# Patient Record
Sex: Female | Born: 1955 | Race: Black or African American | Hispanic: No | Marital: Married | Smoking: Never smoker
Health system: Southern US, Community
[De-identification: ages and names within clinical notes are randomized; demographics above are authoritative.]

## PROBLEM LIST (undated history)

## (undated) DIAGNOSIS — I10 Essential (primary) hypertension: Secondary | ICD-10-CM

## (undated) DIAGNOSIS — J449 Chronic obstructive pulmonary disease, unspecified: Secondary | ICD-10-CM

## (undated) DIAGNOSIS — R112 Nausea with vomiting, unspecified: Secondary | ICD-10-CM

## (undated) DIAGNOSIS — K635 Polyp of colon: Secondary | ICD-10-CM

## (undated) DIAGNOSIS — I359 Nonrheumatic aortic valve disorder, unspecified: Secondary | ICD-10-CM

## (undated) DIAGNOSIS — F32A Depression, unspecified: Secondary | ICD-10-CM

## (undated) DIAGNOSIS — F419 Anxiety disorder, unspecified: Secondary | ICD-10-CM

## (undated) DIAGNOSIS — K219 Gastro-esophageal reflux disease without esophagitis: Secondary | ICD-10-CM

## (undated) DIAGNOSIS — K2270 Barrett's esophagus without dysplasia: Secondary | ICD-10-CM

## (undated) DIAGNOSIS — M1990 Unspecified osteoarthritis, unspecified site: Secondary | ICD-10-CM

## (undated) DIAGNOSIS — Z720 Tobacco use: Secondary | ICD-10-CM

## (undated) DIAGNOSIS — K449 Diaphragmatic hernia without obstruction or gangrene: Secondary | ICD-10-CM

## (undated) DIAGNOSIS — G43909 Migraine, unspecified, not intractable, without status migrainosus: Secondary | ICD-10-CM

## (undated) DIAGNOSIS — M109 Gout, unspecified: Secondary | ICD-10-CM

## (undated) HISTORY — DX: Diaphragmatic hernia without obstruction or gangrene: K44.9

## (undated) HISTORY — DX: Unspecified osteoarthritis, unspecified site: M19.90

## (undated) HISTORY — DX: Nonrheumatic aortic valve disorder, unspecified: I35.9

## (undated) HISTORY — DX: Depression, unspecified: F32.A

## (undated) HISTORY — DX: Anxiety disorder, unspecified: F41.9

## (undated) HISTORY — DX: Gout, unspecified: M10.9

## (undated) HISTORY — DX: Migraine, unspecified, not intractable, without status migrainosus: G43.909

## (undated) HISTORY — DX: Chronic obstructive pulmonary disease, unspecified: J44.9

## (undated) HISTORY — DX: Gastro-esophageal reflux disease without esophagitis: K21.9

## (undated) HISTORY — PX: ABDOMINAL HYSTERECTOMY: SHX81

## (undated) HISTORY — PX: GASTRIC BYPASS: SHX52

---
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
## 1990-08-23 HISTORY — PX: OTHER SURGICAL HISTORY: SHX169

## 1997-12-30 ENCOUNTER — Ambulatory Visit (HOSPITAL_COMMUNITY): Admission: RE | Admit: 1997-12-30 | Discharge: 1997-12-30 | Payer: Self-pay | Admitting: Obstetrics and Gynecology

## 1999-09-16 ENCOUNTER — Ambulatory Visit (HOSPITAL_COMMUNITY): Admission: RE | Admit: 1999-09-16 | Discharge: 1999-09-16 | Payer: Self-pay | Admitting: Obstetrics and Gynecology

## 1999-09-16 ENCOUNTER — Encounter: Payer: Self-pay | Admitting: Obstetrics and Gynecology

## 1999-09-22 ENCOUNTER — Other Ambulatory Visit: Admission: RE | Admit: 1999-09-22 | Discharge: 1999-09-22 | Payer: Self-pay | Admitting: Obstetrics and Gynecology

## 2000-09-14 ENCOUNTER — Other Ambulatory Visit: Admission: RE | Admit: 2000-09-14 | Discharge: 2000-09-14 | Payer: Self-pay | Admitting: Obstetrics and Gynecology

## 2000-09-20 ENCOUNTER — Ambulatory Visit (HOSPITAL_COMMUNITY): Admission: RE | Admit: 2000-09-20 | Discharge: 2000-09-20 | Payer: Self-pay | Admitting: Obstetrics and Gynecology

## 2000-09-20 ENCOUNTER — Encounter: Payer: Self-pay | Admitting: Obstetrics and Gynecology

## 2001-09-26 ENCOUNTER — Ambulatory Visit (HOSPITAL_COMMUNITY): Admission: RE | Admit: 2001-09-26 | Discharge: 2001-09-26 | Payer: Self-pay | Admitting: Obstetrics and Gynecology

## 2001-09-26 ENCOUNTER — Encounter: Payer: Self-pay | Admitting: Obstetrics and Gynecology

## 2001-10-10 ENCOUNTER — Other Ambulatory Visit: Admission: RE | Admit: 2001-10-10 | Discharge: 2001-10-10 | Payer: Self-pay | Admitting: Obstetrics and Gynecology

## 2001-12-14 ENCOUNTER — Ambulatory Visit (HOSPITAL_BASED_OUTPATIENT_CLINIC_OR_DEPARTMENT_OTHER): Admission: RE | Admit: 2001-12-14 | Discharge: 2001-12-14 | Payer: Self-pay | Admitting: Oral Surgery

## 2002-08-23 HISTORY — PX: ACHILLES TENDON REPAIR: SUR1153

## 2002-11-01 ENCOUNTER — Ambulatory Visit (HOSPITAL_COMMUNITY): Admission: RE | Admit: 2002-11-01 | Discharge: 2002-11-01 | Payer: Self-pay | Admitting: Obstetrics and Gynecology

## 2002-11-01 ENCOUNTER — Encounter: Payer: Self-pay | Admitting: Obstetrics and Gynecology

## 2002-11-13 ENCOUNTER — Other Ambulatory Visit: Admission: RE | Admit: 2002-11-13 | Discharge: 2002-11-13 | Payer: Self-pay | Admitting: Obstetrics and Gynecology

## 2003-08-24 HISTORY — PX: CHOLECYSTECTOMY: SHX55

## 2004-06-07 ENCOUNTER — Emergency Department (HOSPITAL_COMMUNITY): Admission: EM | Admit: 2004-06-07 | Discharge: 2004-06-07 | Payer: Self-pay | Admitting: Internal Medicine

## 2004-07-03 ENCOUNTER — Emergency Department (HOSPITAL_COMMUNITY): Admission: EM | Admit: 2004-07-03 | Discharge: 2004-07-03 | Payer: Self-pay | Admitting: Family Medicine

## 2004-08-05 ENCOUNTER — Emergency Department (HOSPITAL_COMMUNITY): Admission: EM | Admit: 2004-08-05 | Discharge: 2004-08-05 | Payer: Self-pay | Admitting: Family Medicine

## 2004-12-16 ENCOUNTER — Ambulatory Visit: Payer: Self-pay | Admitting: Family Medicine

## 2004-12-25 ENCOUNTER — Ambulatory Visit: Payer: Self-pay | Admitting: Family Medicine

## 2005-02-02 ENCOUNTER — Ambulatory Visit: Payer: Self-pay | Admitting: Family Medicine

## 2005-02-10 ENCOUNTER — Ambulatory Visit (HOSPITAL_COMMUNITY): Admission: RE | Admit: 2005-02-10 | Discharge: 2005-02-10 | Payer: Self-pay | Admitting: Family Medicine

## 2005-07-17 ENCOUNTER — Emergency Department (HOSPITAL_COMMUNITY): Admission: EM | Admit: 2005-07-17 | Discharge: 2005-07-17 | Payer: Self-pay | Admitting: Family Medicine

## 2005-07-17 ENCOUNTER — Ambulatory Visit: Payer: Self-pay | Admitting: Sports Medicine

## 2005-07-17 ENCOUNTER — Inpatient Hospital Stay (HOSPITAL_COMMUNITY): Admission: AD | Admit: 2005-07-17 | Discharge: 2005-07-23 | Payer: Self-pay | Admitting: Family Medicine

## 2005-07-26 ENCOUNTER — Ambulatory Visit: Payer: Self-pay | Admitting: Family Medicine

## 2005-07-28 ENCOUNTER — Emergency Department (HOSPITAL_COMMUNITY): Admission: EM | Admit: 2005-07-28 | Discharge: 2005-07-28 | Payer: Self-pay | Admitting: Emergency Medicine

## 2005-07-30 ENCOUNTER — Ambulatory Visit: Payer: Self-pay | Admitting: Family Medicine

## 2005-08-04 ENCOUNTER — Encounter: Admission: RE | Admit: 2005-08-04 | Discharge: 2005-08-04 | Payer: Self-pay | Admitting: Sports Medicine

## 2005-08-06 ENCOUNTER — Ambulatory Visit: Payer: Self-pay | Admitting: Family Medicine

## 2005-08-07 ENCOUNTER — Encounter: Admission: RE | Admit: 2005-08-07 | Discharge: 2005-08-07 | Payer: Self-pay | Admitting: Sports Medicine

## 2005-08-13 ENCOUNTER — Ambulatory Visit: Payer: Self-pay | Admitting: Sports Medicine

## 2005-08-20 ENCOUNTER — Ambulatory Visit: Payer: Self-pay | Admitting: Sports Medicine

## 2005-08-27 ENCOUNTER — Ambulatory Visit: Payer: Self-pay | Admitting: Family Medicine

## 2005-08-30 ENCOUNTER — Ambulatory Visit: Payer: Self-pay | Admitting: Family Medicine

## 2005-09-03 ENCOUNTER — Ambulatory Visit: Payer: Self-pay | Admitting: Sports Medicine

## 2005-09-08 ENCOUNTER — Ambulatory Visit: Payer: Self-pay | Admitting: Family Medicine

## 2005-09-16 ENCOUNTER — Ambulatory Visit: Payer: Self-pay | Admitting: Sports Medicine

## 2005-09-27 ENCOUNTER — Ambulatory Visit: Payer: Self-pay | Admitting: Family Medicine

## 2005-10-11 ENCOUNTER — Ambulatory Visit: Payer: Self-pay | Admitting: Family Medicine

## 2005-10-25 ENCOUNTER — Ambulatory Visit: Payer: Self-pay | Admitting: Family Medicine

## 2005-11-08 ENCOUNTER — Ambulatory Visit: Payer: Self-pay | Admitting: Family Medicine

## 2005-11-22 ENCOUNTER — Ambulatory Visit: Payer: Self-pay | Admitting: Sports Medicine

## 2005-12-06 ENCOUNTER — Ambulatory Visit: Payer: Self-pay | Admitting: Family Medicine

## 2005-12-20 ENCOUNTER — Ambulatory Visit: Payer: Self-pay | Admitting: Family Medicine

## 2006-01-04 ENCOUNTER — Ambulatory Visit: Payer: Self-pay | Admitting: Sports Medicine

## 2006-01-14 ENCOUNTER — Ambulatory Visit: Payer: Self-pay | Admitting: Family Medicine

## 2006-01-28 ENCOUNTER — Ambulatory Visit: Payer: Self-pay | Admitting: Family Medicine

## 2006-02-01 ENCOUNTER — Ambulatory Visit: Payer: Self-pay | Admitting: Family Medicine

## 2006-02-11 ENCOUNTER — Ambulatory Visit: Payer: Self-pay | Admitting: Family Medicine

## 2006-08-31 ENCOUNTER — Encounter: Payer: Self-pay | Admitting: Family Medicine

## 2006-08-31 ENCOUNTER — Ambulatory Visit: Payer: Self-pay | Admitting: Family Medicine

## 2006-08-31 LAB — CONVERTED CEMR LAB
Albumin: 4.5 g/dL (ref 3.5–5.2)
BUN: 14 mg/dL (ref 6–23)
CO2: 25 meq/L (ref 19–32)
Calcium: 9.9 mg/dL (ref 8.4–10.5)
Chloride: 100 meq/L (ref 96–112)
Glucose, Bld: 108 mg/dL — ABNORMAL HIGH (ref 70–99)
HDL: 51 mg/dL (ref >39)
Potassium: 3.9 meq/L (ref 3.5–5.3)
Triglycerides: 156 mg/dL — ABNORMAL HIGH (ref <150)

## 2006-09-14 ENCOUNTER — Ambulatory Visit (HOSPITAL_COMMUNITY): Admission: RE | Admit: 2006-09-14 | Discharge: 2006-09-14 | Payer: Self-pay | Admitting: Obstetrics and Gynecology

## 2006-09-21 ENCOUNTER — Ambulatory Visit: Payer: Self-pay | Admitting: Family Medicine

## 2006-10-20 DIAGNOSIS — N809 Endometriosis, unspecified: Secondary | ICD-10-CM | POA: Insufficient documentation

## 2006-10-20 DIAGNOSIS — F339 Major depressive disorder, recurrent, unspecified: Secondary | ICD-10-CM | POA: Insufficient documentation

## 2006-10-20 DIAGNOSIS — F329 Major depressive disorder, single episode, unspecified: Secondary | ICD-10-CM

## 2006-10-20 DIAGNOSIS — E669 Obesity, unspecified: Secondary | ICD-10-CM

## 2006-10-20 DIAGNOSIS — I8010 Phlebitis and thrombophlebitis of unspecified femoral vein: Secondary | ICD-10-CM | POA: Insufficient documentation

## 2006-10-22 ENCOUNTER — Emergency Department (HOSPITAL_COMMUNITY): Admission: EM | Admit: 2006-10-22 | Discharge: 2006-10-22 | Payer: Self-pay | Admitting: Family Medicine

## 2006-10-26 ENCOUNTER — Telehealth: Payer: Self-pay | Admitting: *Deleted

## 2006-11-01 ENCOUNTER — Ambulatory Visit: Payer: Self-pay | Admitting: Family Medicine

## 2006-11-01 DIAGNOSIS — I10 Essential (primary) hypertension: Secondary | ICD-10-CM

## 2006-11-01 DIAGNOSIS — J309 Allergic rhinitis, unspecified: Secondary | ICD-10-CM | POA: Insufficient documentation

## 2006-11-17 ENCOUNTER — Encounter: Payer: Self-pay | Admitting: Family Medicine

## 2006-11-17 ENCOUNTER — Ambulatory Visit: Payer: Self-pay | Admitting: Family Medicine

## 2006-11-17 LAB — CONVERTED CEMR LAB
BUN: 18 mg/dL (ref 6–23)
CO2: 22 meq/L (ref 19–32)
Chloride: 103 meq/L (ref 96–112)
Creatinine, Ser: 1.15 mg/dL (ref 0.40–1.20)
Potassium: 4 meq/L (ref 3.5–5.3)

## 2006-11-25 IMAGING — US US ABDOMEN COMPLETE
1 series · 14 of 25 positions shown · non-contrast
Comparison: none

CLINICAL DATA: Abdominal pain particularly right upper quadrant.  Vomiting. 
 ULTRASOUND ABDOMEN COMPLETE:
TECHNIQUE: Complete abdominal ultrasound examination was performed including evaluation of the liver, gallbladder, bile ducts, pancreas, kidneys, spleen, IVC, and abdominal aorta.

[Series 1: unknown · 14 of 72 slices shown]
[im 1/72]
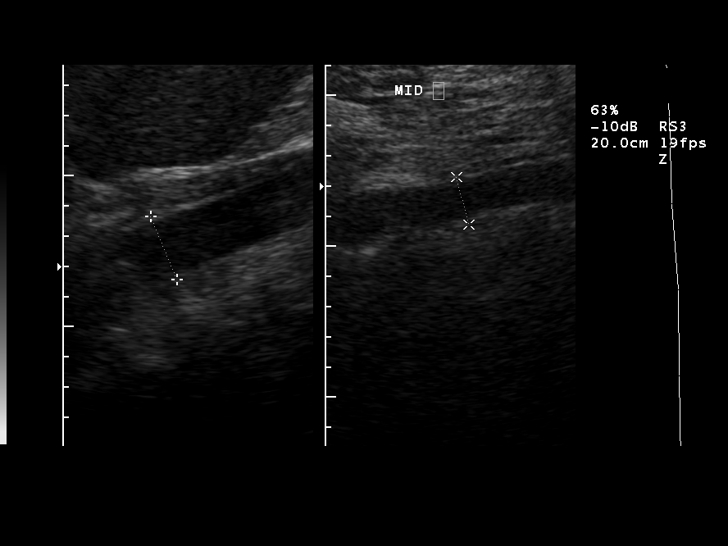
[im 6/72]
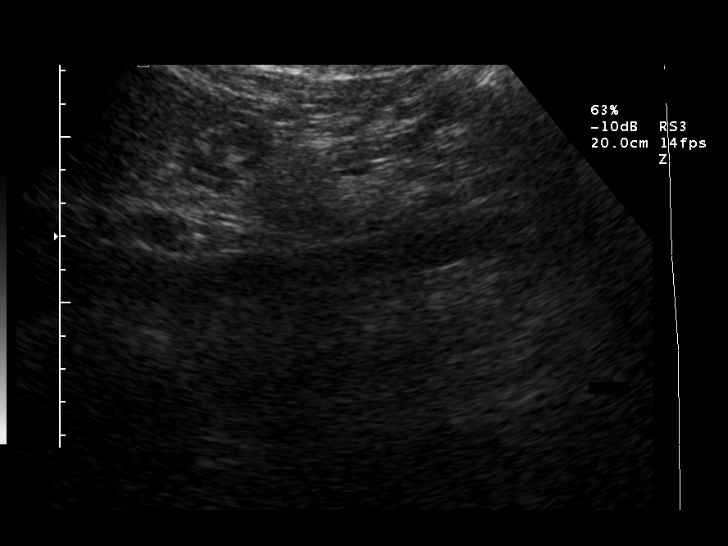
[im 12/72]
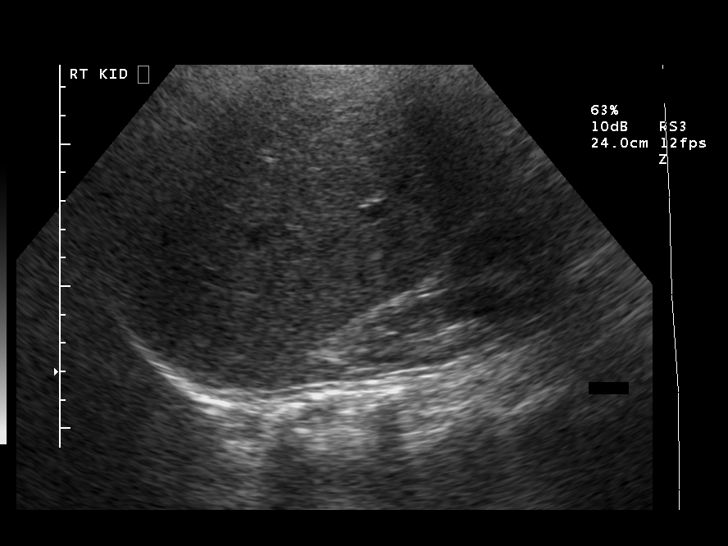
[im 18/72]
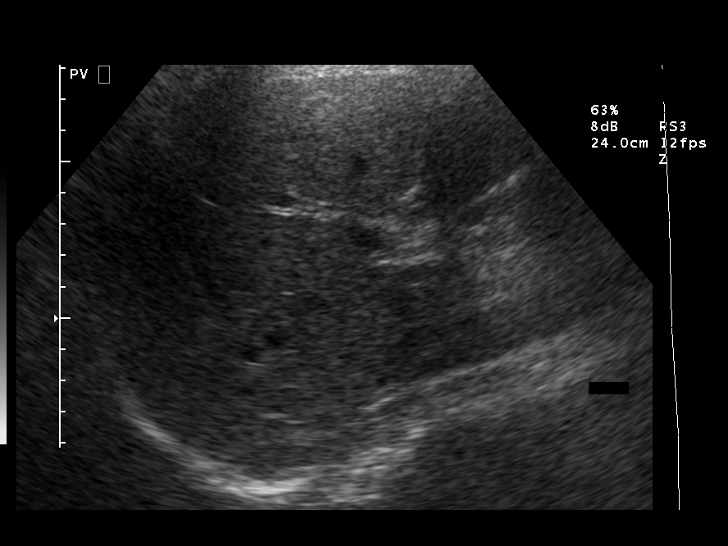
[im 24/72]
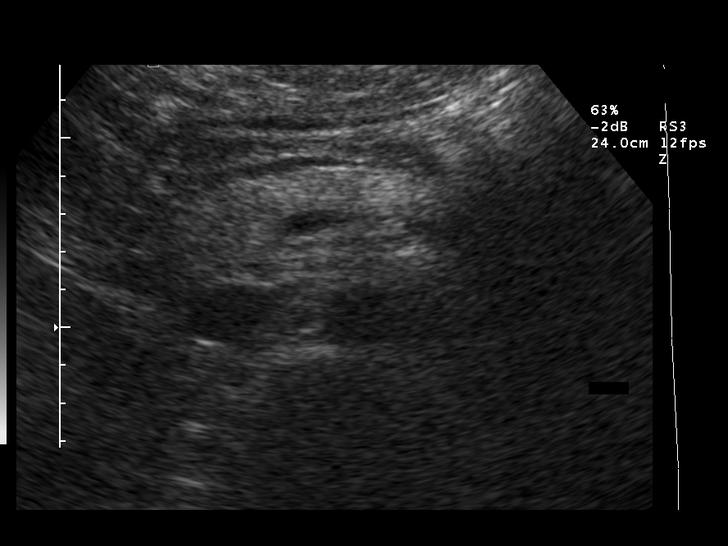
[im 27/72]
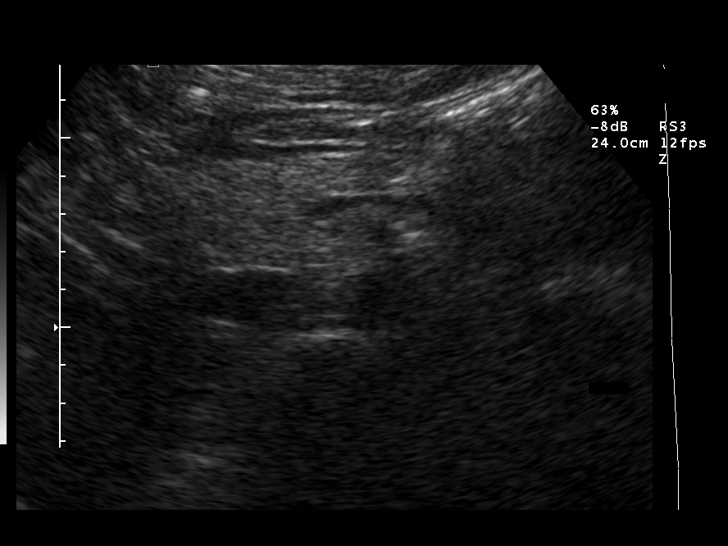
[im 33/72]
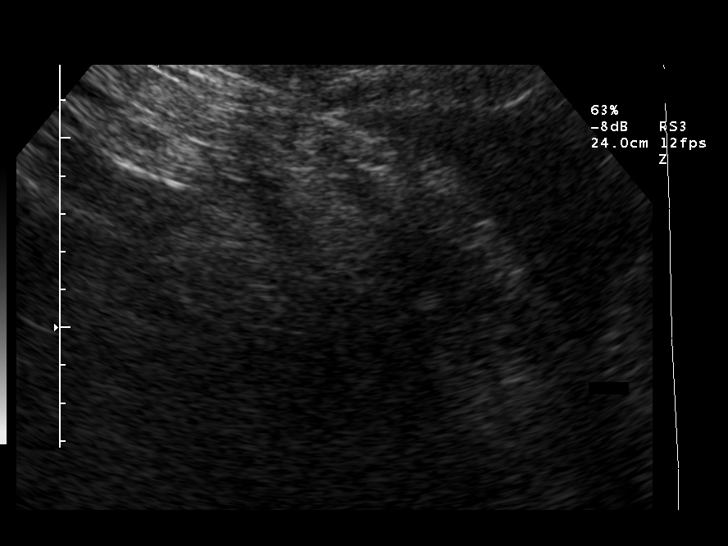
[im 39/72]
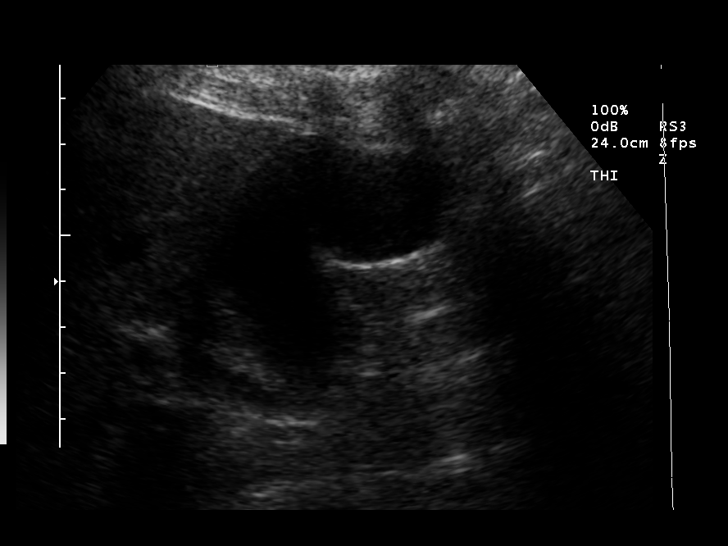
[im 45/72]
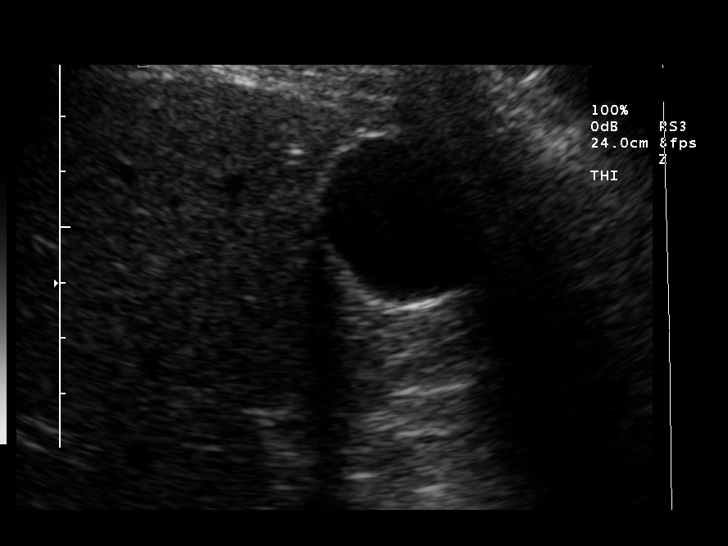
[im 48/72]
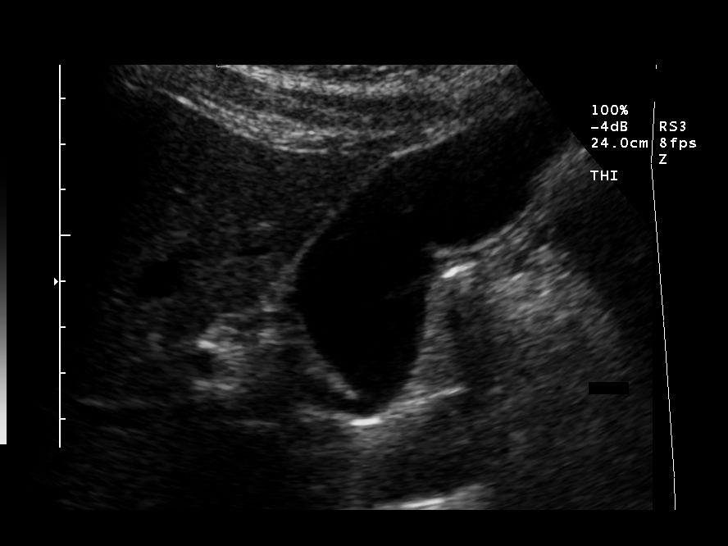
[im 54/72]
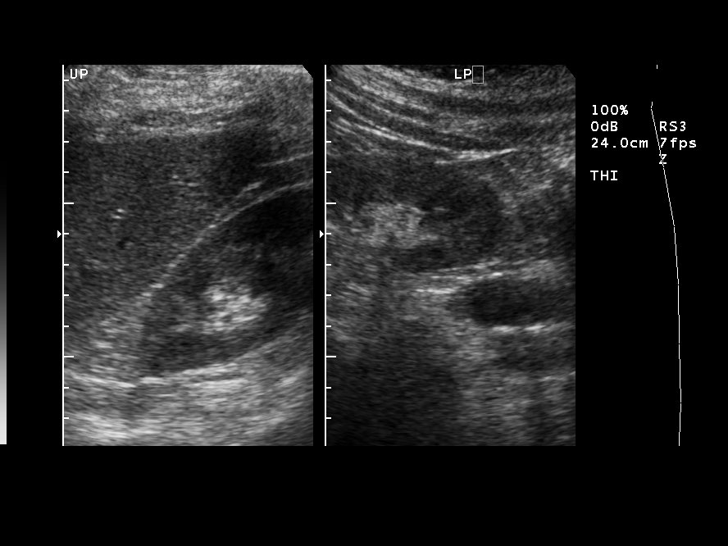
[im 60/72]
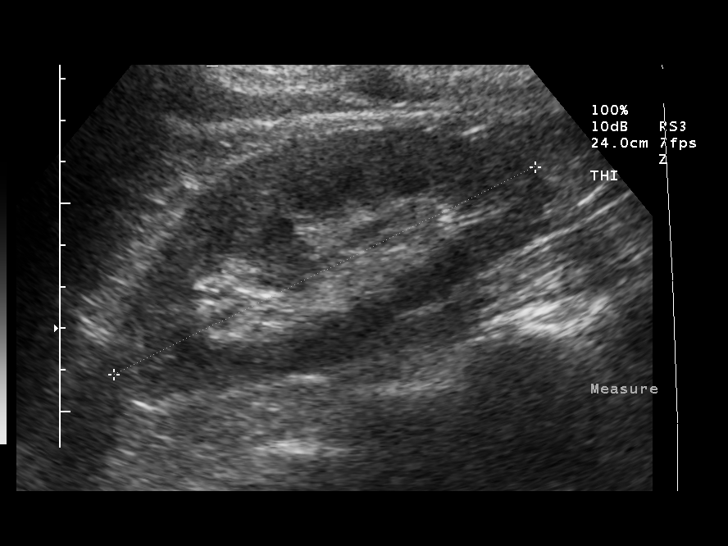
[im 66/72]
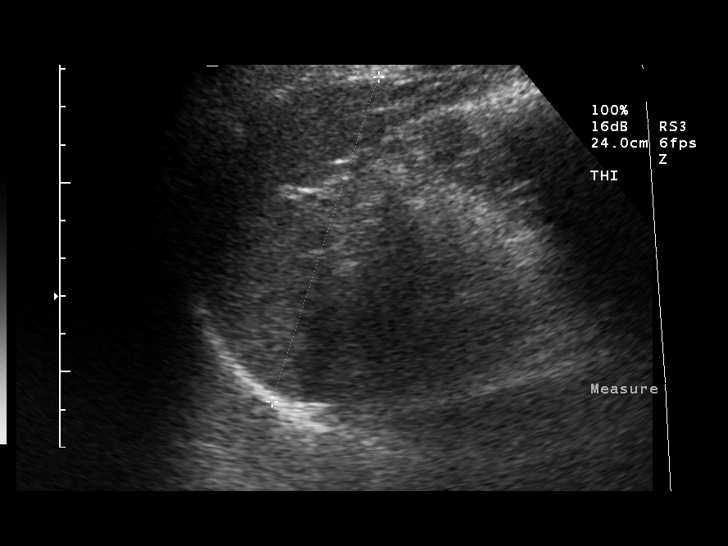
[im 72/72]
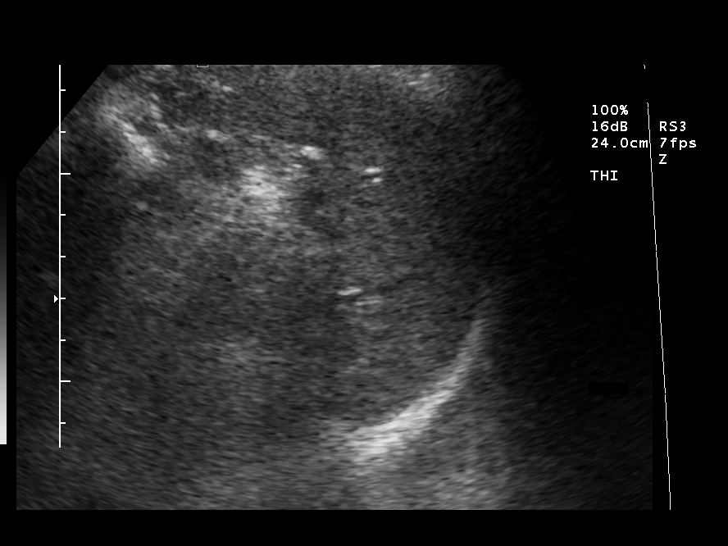

[14 of 25 positions shown; findings below may reference images not displayed]

FINDINGS: Scans over the upper abdomen were performed.  The gallbladder is well seen and no gallstones are noted.  The liver is somewhat echogenic consistent with fatty infiltration.  The common bile duct is normal measuring 3.2 mm in diameter.  IVC and spleen appear normal.  Only the tail of the pancreas is obscured by bowel gas.  No hydronephrosis is seen.  The right kidney measures 12.0 cm sagittally with the left kidney measuring 11.5 cm.  The abdominal aorta is normal in caliber.
IMPRESSION: 1.  No gallstones. 
 2.  Mild fatty infiltration of the liver. 
 3.  Tail of the pancreas obscured by bowel gas.

## 2007-09-20 ENCOUNTER — Encounter (INDEPENDENT_AMBULATORY_CARE_PROVIDER_SITE_OTHER): Payer: Self-pay | Admitting: Family Medicine

## 2007-09-22 ENCOUNTER — Encounter (INDEPENDENT_AMBULATORY_CARE_PROVIDER_SITE_OTHER): Payer: Self-pay | Admitting: Family Medicine

## 2010-02-20 ENCOUNTER — Emergency Department (HOSPITAL_COMMUNITY): Admission: EM | Admit: 2010-02-20 | Discharge: 2010-02-20 | Payer: Self-pay | Admitting: Family Medicine

## 2010-07-22 ENCOUNTER — Encounter: Payer: Self-pay | Admitting: Gastroenterology

## 2010-07-22 ENCOUNTER — Ambulatory Visit: Payer: Self-pay | Admitting: Endocrinology

## 2010-07-22 ENCOUNTER — Ambulatory Visit: Payer: Self-pay | Admitting: Neurosurgery

## 2010-08-05 ENCOUNTER — Ambulatory Visit: Payer: Self-pay | Admitting: Neurosurgery

## 2010-08-05 ENCOUNTER — Ambulatory Visit: Payer: Self-pay | Admitting: Endocrinology

## 2010-08-05 DIAGNOSIS — E039 Hypothyroidism, unspecified: Secondary | ICD-10-CM | POA: Insufficient documentation

## 2010-08-05 HISTORY — DX: Hypothyroidism, unspecified: E03.9

## 2010-08-05 NOTE — Progress Notes (Addendum)
 Endocrinology Consult Note   August 05, 2010     Dear Dr. Dois Davenport RUBIO:     Thank you for requesting a consultation on your patient, Brandi Warner, in the   [facility]. She is a 54 year old female   being seen today for evaluation of  possible Cushing's.  Marland Kitchen  HPI   Brandi Warner is a 54yo female with history of hypothyroidism who presents today   with multiple complaints.  She states that approximately 9 years ago she   had a sudden increase in her wieght with gain of over 100 pounds in one   year's time.  Since then she reports she had fluctuating blood pressure and   increased lower extremity edema.  Over the past one year she has noted a   loss of concentration, increasing confusion, and short term memory loss   that has impacted daily activities.  She also states she has become more   easily irritated and also cries more easily.    In reviewing her past she states she had menarche at age 77 with regular   menses until 5-6 years ago.  At that time they became very irregular.  She   says she is going through menopause and has not had a menses since August   2010.  She has not been pregnant though did states she had tried to   conceive.  Current Meds   Maxalt 10 MG Tablet;TAKE 1 TABLET AT ONSET OF HEADACHE. MAY REPEAT EVERY 2   HOURS AS NEEDED. MAXIMUM 3 TABLETS IN 24 HOURS.; RPT  CeleXA 20 MG Tablet;TAKE 1 TABLET DAILY.; RPT  ALPRAZolam 0.25 MG Tablet;TAKE 1 TABLET EVERY 6 HOURS AS NEEDED FOR   ANXIETY.; RPT  Levothyroxine Sodium 50 MCG Tablet;TAKE 1 TABLET DAILY.; RPT  Amitriptyline HCl 10 MG Tablet;TAKE 1 TABLET DAILY; RPT  Vitamin D TABS;1.25mg  weekly; RPT  NexIUM 40 MG Capsule Delayed Release;TAKE 1 CAPSULE TWICE DAILY; RPT  TraMADol HCl 50 MG Tablet;TAKE 1 TABLET EVERY 6 HOURS PRN pain; RPT pain.  Allergies   No Known Drug Allergy.  PMH   Hypothyroidism   (244.9).  PSH   History of nasal endoscopy with polypectomy  1994;  History of   cholecystectomy  2005;  History of oophorectomy  on the left  1978  Family Hx   Fraternal history of Hypothyroidism  Father - commited suicide at age 26  mother - alive age 41, s/p PPM  brother - age 45 alive and well  brother - age 65 HTN, gallbladder dz  paternal grandfather - died reportedly of goiter.  Personal Hx   Previously worked as a Arboriculturist.  Married and lives with her husband.  High school education  No EtOH use  current smoker 1ppd x 32 years  No illicit drug use.  ROS   Weight gain of over 100 pounds over one year time period.  intermittent shortness of breath  intermittent sharp chest pains  GERD  goiter that she states she feels like she is suffocating when lies flat  cries easily and very irritable  increased hair growth on her face that she shaves  reports feeling fatigued and sleeping 16-18 hr a day.  Vital Signs   Recorded by [email or web address] on 05 Aug 2010 03:37 PM  BP:138/75,   HR: 102 b/min,   Height: 65 in, Weight: 208 lb, BMI: 34.6 kg/m2.  Physical Exam   GENERAL APPEARANCE: Appears stated age, well appearing, NAD, obese female  HEENT: PERRL, EOMI, oropharynx clear.  no plethora  LUNGS: Clear to auscultation throughout bilateral lung fields  HEART: Normal S1,S2 without murmurs, gallops, or rubs  ABDOMEN: NABS, soft, non-tender, no stria  EXTREMITIES: Without clubbing or cyanosis.  Bilateral LE non-pitting edema  NEUROLOGIC: Alert and oriented x3, cranial nerves II-XII intact,   motor/sensory exam normal, DTRs symmetric, normal gait.  Results   05/04/2010: Free T4 1.01      thyroid peroxidase Ab  0.3      thyroglobulin Ab   30      TSH 0.45       24hr free cortisdol urine   51.60  05/29/2010: ACTH 8      cortisol 1.0 (at 8:30 AM) - post dexamethasone at 11pm night before  07/14/2010: salivary cortisol 0.16        MRI head 07/17/10:  normal MRI brain, normal MRI pituitary gland  CT chest with contrast 12/29/2009:  possible left adrenal nodule or   hypertrophy.  Assessment   In summary Brandi Warner is a pleasant 54 year old female with hypothyroidism on    replacement, vitamin D deficiency on replacement who is being evaluated for   possible Cushing's disease.  Per the above lab results, her salivary   cortisol is elevated while her 24 hour urine cortisol is near normal.  It   appears that she does supress with dexamethasone based on the AM cortisol   after taking dexamethasone the previous night.  Given these results, it   does not appear that she has Cushing's.  We will however, repeat her late   night salivary cortisol x 3, recheck TFTs, and repeat the dexamethasone   suppression test.     Given that the CT showing a possible adrenal nodule versus hypertrophy was   a chest CT it may be beneficial to obtain an adrenal focused CT scan to   better evaluate this area.  We will defer this decision to you, and would   not initiate any additional biochemical testing to evaluate such a lesion   until it is confirmed by specific adrenal imaging.     Thank you for allowing [place] to participate in the care of your patient,   please feel free to contact us with any further questions.  Attestation   --I saw and evaluated the patient. I agree with Dr. Adolph Pollack findings and   plan of care as documented above.           .  Signature   Electronically signed by: Leona Carry  MD - Res.; 08/05/2010 3:56 PM   EST.  Electronically signed by: Michell Heinrich  M.D.; 08/05/2010 4:31 PM EST;   Preceptor.

## 2010-08-11 ENCOUNTER — Other Ambulatory Visit: Payer: Self-pay | Admitting: Neurosurgery

## 2010-08-31 NOTE — Miscellaneous (Unsigned)
 Continuity of Care Record  Created: todo  From: Nori Riis  From:   From: [employer], EHR v10.2.7.53  To: Harlin Rain  Purpose: Patient Use;       Family History  Fraternal history of Hypothyroidism    Alerts  Allergy - No Known Drug Allergy     Medications  ALPRAZolam 0.25 MG Tablet; TAKE 1 TABLET EVERY 6 HOURS AS NEEDED FOR   ANXIETY. ; RPT   Amitriptyline HCl 10 MG Tablet; TAKE 1 TABLET DAILY ; RPT   CeleXA 20 MG Tablet; TAKE 1 TABLET DAILY. ; RPT   Dexamethasone 1 MG Tablet; Take one tablet before bed as part of dex   suppression test ; Rx   Levothyroxine Sodium 50 MCG Tablet; TAKE 1 TABLET DAILY. ; RPT   Maxalt 10 MG Tablet; TAKE 1 TABLET AT ONSET OF HEADACHE. MAY REPEAT EVERY 2   HOURS AS NEEDED. MAXIMUM 3 TABLETS IN 24 HOURS. ; RPT   NexIUM 40 MG Capsule Delayed Release; TAKE 1 CAPSULE TWICE DAILY ; RPT   TraMADol HCl 50 MG Tablet; TAKE 1 TABLET EVERY 6 HOURS PRN pain ; RPT   Vitamin D TABS; 1.25mg  weekly ; RPT

## 2011-01-08 NOTE — H&P (Signed)
NAME:  Karina Ayers, Karina Ayers                  ACCOUNT NO.:  [id number]   MEDICAL RECORD NO.:  [id number]           PATIENT TYPE:   LOCATION:                                 FACILITY:   PHYSICIAN:  Melina Fiddler, MD DATE OF BIRTH:  Nov 23, 1955   DATE OF ADMISSION:  07/17/2005  DATE OF DISCHARGE:                                HISTORY & PHYSICAL   CHIEF COMPLAINT:  Left leg pain.   HISTORY OF PRESENT ILLNESS:  This is a 55 year old African American female,  patient of [facility], who complains of an  approximately 3-day history of left leg pain with progressive sense of  swelling and tightness.  She states the pain began after she took a trip to  [place] and back that was approximately 7.5 hours one way, during which time  she sat in the car.  She has no history of trauma to the leg.  She tried  Naproxen for pain relief with no effect.  She reported to Urgent Care this  morning and was then sent on to [facility] for evaluation and was  found to have a DVT, after having lower extremity Doppler studies performed.  DVT had localized to the left lower extremity.  [facility] was [facility] to [facility] for management by the family practice teaching  service.   REVIEW OF SYSTEMS:  Significant for nausea but no vomiting over the last  couple of days.  No fever.  No chest pain.  No shortness of breath.  She  denies rashes but does admit to bruising easily over her body over the last  two months.  Apparently this had been addressed by her primary care  physician, Dr. [name], but no intervention had been undertaken at this  time.   PAST MEDICAL HISTORY:  1.  Hypertension.  2.  Obesity.  3.  Endometriosis.   MEDICATIONS:  1.  HCTZ 25 mg p.o. every day.  2.  Naproxen 500 mg p.o. b.i.d. p.r.n.   ALLERGIES:  NEOSPORIN.   PAST SURGICAL HISTORY:  1.  Hysterectomy and bilateral salpingo-oophorectomy.  2.  Bladder surgery x2.   FAMILY HISTORY:   Father and mother with hypertension.  Mother and sister  with diabetes.  Mother with thyroid disease.   SOCIAL HISTORY:  The patient is married.  She has one son living and a  second son who is deceased secondary to cancer.  She does not smoke or  drink.  She is unemployed and spends time taking care of her father.   PHYSICAL EXAMINATION:  VITAL SIGNS:  Temp 97.4, blood pressure 141/85, heart  rate 71, respirations 20, O2 sat 98% on room air.  GENERAL:  This is an obese African American female in no apparent distress.  She is alert and oriented x3.  HEENT:  PERRL.  EOMI.  MMM.  Nares and oropharynx are clear.  No JVD.  No  carotid bruits.  CHEST:  Lungs are clear to auscultation bilaterally.  HEART:  She has a regular rate and rhythm with a  2/6 systolic ejection  murmur heard best at the left second intercostal space.  No CVA tenderness  to palpation.  ABDOMEN:  Obese, soft, nontender, nondistended with positive bowel sounds.  No organomegaly.  EXTREMITIES:  With 1+ radial and pedal pulses bilaterally.  The patient does  have diffuse tenderness to palpation of the left posterior calf up to the  mid thigh.  No lower extremity edema with only slightly noticeable swelling  of the left lower extremity, when compared to the right.  She does have some  mild bruising over the belly of the gastroc on the left leg.  SKIN:  She has diffuse ecchymoses ranging in size from 5-mm to 3-or-4-cm  over her arms and legs.  No petechiae noted.  NEUROLOGIC: With 2+ DTRs at the level of patella and Achilles bilaterally.  Cranial nerves III-XII intact.  RECTAL:  Good rectal tone.  No stool in the vault.  No masses palpated.   DATA:  Hemoglobin 12.6, hematocrit 38, white blood cell count 6.4, platelets  313.  Sodium 137, potassium 3.4, chloride 101, bicarb 28, BUN 11, creatinine  0.8, glucose 96.  PT INR and a PTT are pending.  Hemoccult pending.  Lower  extremity Doppler studies reveal DVT in the left  popliteal area, official  report unavailable at this time.   ASSESSMENT:  This is a 55 year old African American female with deep vein  thrombosis.   PLAN:  1.  Deep vein thrombosis.  We will await hemoccult but plan to check her PT      INR and PTT and then initiate Lovenox at a therapeutic dosing level.      Follow that with initiation of Coumadin with a goal INR of between 2-3.      The patient will need to be therapeutic on her Coumadin prior to      discharge.  We will allow for ambulation at this time.  Provide Tylenol      or Percocet for pain on an as needed basis.  Consider further      anticoagulation workup by the inpatient team.  2.  Hypertension.  Only a borderline high value at this time.  We will      continue the patient's home regimen of HCTZ 25 mg orally every day.      Franklyn Lor, MD    ______________________________  Melina Fiddler, MD    TD/MEDQ  D:  07/17/2005  T:  07/17/2005  Job:  [id number]

## 2011-01-08 NOTE — Discharge Summary (Signed)
NAME:  Karina Ayers, Karina Ayers                  ACCOUNT NO.:  [id number]   MEDICAL RECORD NO.:  [id number]          PATIENT TYPE:  INP   LOCATION:  5022                         FACILITY:  [facility]   PHYSICIAN:  Santiago Bumpers. Hensel, M.D.DATE OF BIRTH:  01-15-1956   DATE OF ADMISSION:  07/17/2005  DATE OF DISCHARGE:  07/23/2005                                 DISCHARGE SUMMARY   PRIMARY CARE PHYSICIAN:  [name], M.D., [facility].   FINAL DIAGNOSES:  1.  Left lower extremity deep vein thrombosis.  2.  Hypertension.  3.  Obesity.   HISTORY AND PHYSICAL:  Please see admission H&P for full history and  physical.   HISTORY OF PRESENT ILLNESS:  The patient is a 55 year old African-American  female who presented with a 3 day history of left lower extremity pain with  progressive swelling and tightness after a long car ride of approximately  7.5 hours. She had Dopplers performed at an outside hospital, which showed a  left lower extremity DVT.   LABORATORY DATA:  The patient's admission labs were as follows:  Sodium of  137, potassium 3.4, chloride 101, bicarb 28, glucose 96, BUN 11, creatinine  0.8, calcium 9.1. White blood cells 6.4. Hemoglobin and hematocrit 12.6 and  38.0. Platelets 313,000. Initial PT and INR, PT was 12.9 and INR was 1. At  time of discharge, patient's INR was 2 and PT was 22.5. That is on July 23, 2005. Stools were Hemocculted and found to be negative.   HOSPITAL COURSE:  The patient is a 55 year old African-American female with  a history of hypertension, here for a left lower extremity DVT.   PROBLEM 1.  LEFT LOWER EXTREMITY DVT:  The patient was started on Lovenox  and Coumadin. INRs were checked daily. Goal INR was 2 to 3. The patient was  discharged with an INR of 2. The patient did report a history of leg  swelling at various times prior to this DVT. Will let patient's primary care  Karina Ayers decide whether or not she should get a coagulation workup  later.  She did complain of a lot of left lower extremity pain while she was here.  She was given Percocet p.r.n. as well as Tylenol p.r.n.   PROBLEM 2.  HYPERTENSION:  The patient was continued on her home dose of  HCTZ at 25 mg daily. Blood pressures remained well controlled.   PROBLEM 3.  NAUSEA:  The patient did report nausea starting prior to her  hospitalization. She was given Phenergan p.r.n. There was never a clear  cause of her nausea. However, she did say that her nausea was improved her  last day of her hospitalization. It is possible that since patient did have  a DVT, that she may have had some type of clot on her diaphragm as well,  causing nausea in her stomach, however, this would be unusual since she did  not have any shortness of breath. The patient will followup with this with  her outpatient Karina Ayers.   DISCHARGE INSTRUCTIONS:  1.  The patient was advised not  to change her diet drastically and      especially not to change her eating habits of green leafy vegetables,      since she is on Coumadin.  2.  She was told to be light on her activity. No running or intense activity      while still has leg pain and tightness.   FOLLOW UP:  1.  She is to followup with [name] at the [facility].      Appointment is for July 30, 2005 at 11:15 a.m.  2.  She is also to return to the [facility] on Monday morning,      July 26, 2005. It does not have to be in the morning, but anytime      during that day to get an INR checked.   DISCHARGE MEDICATIONS:  1.  Coumadin 10 mg p.o. q.h.s.  2.  HCTZ 25 mg p.o. daily.  3.  Tylenol 650 mg, take 1 tablet p.o. q.4h. p.r.n. pain.   NOTE:  The patient was given prescription for the Coumadin but not HCTZ, as  she has that at home.      Sharin Grave, MD    ______________________________  Santiago Bumpers Leveda Anna, M.D.    AM/MEDQ  D:  07/24/2005  T:  07/24/2005  Job:  [id number]   cc:   William A.  Leveda Anna, M.D.  Fax: [phone]   [name], M.D.  [facility]

## 2011-04-05 ENCOUNTER — Inpatient Hospital Stay (INDEPENDENT_AMBULATORY_CARE_PROVIDER_SITE_OTHER)
Admission: RE | Admit: 2011-04-05 | Discharge: 2011-04-05 | Disposition: A | Discharge status: Home / Self Care | Payer: Self-pay | Source: Ambulatory Visit | Attending: Emergency Medicine | Admitting: Emergency Medicine

## 2011-04-05 DIAGNOSIS — M25519 Pain in unspecified shoulder: Secondary | ICD-10-CM

## 2011-04-14 ENCOUNTER — Ambulatory Visit (HOSPITAL_COMMUNITY)
Admission: RE | Admit: 2011-04-14 | Discharge: 2011-04-14 | Disposition: A | Discharge status: Home / Self Care | Payer: Self-pay | Source: Ambulatory Visit | Attending: Family Medicine | Admitting: Family Medicine

## 2011-04-14 ENCOUNTER — Ambulatory Visit (INDEPENDENT_AMBULATORY_CARE_PROVIDER_SITE_OTHER): Payer: Self-pay | Admitting: Family Medicine

## 2011-04-14 ENCOUNTER — Encounter: Payer: Self-pay | Admitting: Family Medicine

## 2011-04-14 VITALS — BP 163/108 | HR 88 | Ht 62.0 in | Wt 253.0 lb

## 2011-04-14 DIAGNOSIS — X500XXA Overexertion from strenuous movement or load, initial encounter: Secondary | ICD-10-CM | POA: Insufficient documentation

## 2011-04-14 DIAGNOSIS — M25519 Pain in unspecified shoulder: Secondary | ICD-10-CM

## 2011-04-14 DIAGNOSIS — M542 Cervicalgia: Secondary | ICD-10-CM | POA: Insufficient documentation

## 2011-04-14 DIAGNOSIS — M5380 Other specified dorsopathies, site unspecified: Secondary | ICD-10-CM | POA: Insufficient documentation

## 2011-04-14 NOTE — Patient Instructions (Signed)
1. You may wear your sling for comfort but be sure to move your shoulder around daily so that you will not get stiff.  2. Continue taking the NSAID that you got from urgent care for your pain.  3. Get your xrays today at [facility].  4. We will call you with your results and to set up your MRI after your xray results are back.  5.  You should follow up after your MRI is completed.

## 2011-04-15 ENCOUNTER — Encounter: Payer: Self-pay | Admitting: *Deleted

## 2011-04-15 DIAGNOSIS — M25519 Pain in unspecified shoulder: Secondary | ICD-10-CM | POA: Insufficient documentation

## 2011-04-15 NOTE — Patient Instructions (Signed)
MRI OF CSPINE IS ON MON AUG 27TH AT 1PM AT [facility]. [phone]

## 2011-04-15 NOTE — Assessment & Plan Note (Signed)
Due to the radicular pattern of the pain an MRI is in order.  A plain film of the shoulder was ordered to evaluate the bony shoulder but I don't suspect this is a bony problem of the shoulder girdle.  The clavicle can also be evaluated in this film as there was some concern that there was a clavicular fracture.  I don't suspect a clavicular fracture as there is no trauma.  A plain film of the cervical spine is also ordered to evaluate for any contributing bony abnormalities.  The patient can wear her sling with activity if she likes since it helps her feel comfortable.  I warned her of the possibility of the shoulder getting stiff thereby increasing her pain if she wears it too much.  She will be sure to remove it and do some range of motion exercises daily.  She will apply for the [facility] card on 8/22 so that she will have a means to pay for the MRI.  She will follow up with [facility] after the MRI is completed.

## 2011-04-15 NOTE — Progress Notes (Signed)
  Subjective:    Patient ID: Karina Ayers, female    DOB: 05-03-1956, 55 y.o.   MRN: [id number]  HPI 56 y/o female is complaining of left neck pain and shoulder pain that sometimes radiates down to her hand x4 weeks.  It started when she was sitting in her lounge chair.  She turned to look over her left shoulder, heard a pop, and felt an immediate pain travel down her shoulder into her hand.  The pain has been present ever since.  She was seen in urgent care where she was given an narcotic and an NSAID.  She has been taking the NSAID but prefers not to take the narcotic.  The pain is worse with neck turning and reaching with the left arm.  A doctor at her church recommended that she wear a sling and this has helped the pain.  She also gets some relief when she stretches the upper chest area.  At some point when it first happened she felt like "something moved and then popped back in"  Around her neck area.  When it first happened her husband noticed a "lump" in her mid clavicle area which has since resolved.  She does feel a pin and needle sensation in the arm and fingers but has not had a feeling that it is totally numb or fallen asleep.   Review of Systems     Objective:   Physical Exam Neck: No masses. Midline tenderness to palpation around c5-c7 Trapezius is tight and tender to palpation Flexes and extends the neck without difficulty Pain is reproduced (to the mid arm) with rotation to the left.  Shoulder: rotator cuff weakness? Vs. Patient anticipation of  Pain ROM is full Impingement testing is equivocal.  Shoulder pain is somewhat global started at the neck and continuing to the anterior shoulder.  Posterior shoulder pain about the scapula.  No pain on the lateral shoulder  Arm: No tenderness to palpation Normal biceps strength Bicep reflex 2+  Hand/wrist Non tender Normal movement Normal strength         Assessment & Plan:

## 2011-04-19 ENCOUNTER — Other Ambulatory Visit (HOSPITAL_COMMUNITY): Payer: Self-pay

## 2011-04-21 ENCOUNTER — Ambulatory Visit (INDEPENDENT_AMBULATORY_CARE_PROVIDER_SITE_OTHER): Payer: Self-pay | Admitting: Family Medicine

## 2011-04-21 DIAGNOSIS — M25519 Pain in unspecified shoulder: Secondary | ICD-10-CM

## 2011-04-21 MED ORDER — CYCLOBENZAPRINE HCL 10 MG PO TABS: 10.0000 mg | ORAL_TABLET | Freq: Three times a day (TID) | ORAL | Status: AC | PRN

## 2011-04-21 MED ORDER — MELOXICAM 15 MG PO TABS: 15.0000 mg | ORAL_TABLET | Freq: Every day | ORAL | Status: AC

## 2011-04-22 NOTE — Progress Notes (Signed)
  Subjective:    Patient ID: Karina Ayers, female    DOB: [birth year], 55 y.o.   MRN: [id number]  HPI 55 y/o female is here for follow up for neck and shoulder pain.  See previous note for details.  Her pain is somewhat improved with NSAIDS and topical home pain remedy.  The pain is still worse with activity and relieved with rest.  She continues to get occasional pain down the arm.     Review of Systems     Objective:   Physical Exam Neck: Moves in all directions No midline tenderness Tender of left trap which is in spasm  Shoulder: No tenderness over the clavicle Motion is 0-180 but has pain with movement Rotator strength is 4/5 likely confounded by pain  Plain films of neck and shoulder: ess normal       Assessment & Plan:

## 2011-04-22 NOTE — Assessment & Plan Note (Signed)
MRI is not able to be ordered at this time due to cost.  We will proceed with physical therapy for now.  Adding flexeril which she can take TID if tolerated or at least qhs.  Mobic refilled.  If she doesn't have significant improvement with physical therapy we will consider a surgical consultation.

## 2012-01-21 ENCOUNTER — Encounter: Payer: Self-pay | Admitting: Gastroenterology

## 2013-01-05 ENCOUNTER — Ambulatory Visit: Payer: Self-pay | Admitting: Family Medicine

## 2013-01-05 ENCOUNTER — Emergency Department (HOSPITAL_COMMUNITY)
Admission: EM | Admit: 2013-01-05 | Discharge: 2013-01-05 | Disposition: A | Discharge status: Home / Self Care | Payer: No Typology Code available for payment source | Source: Home / Self Care

## 2013-01-05 ENCOUNTER — Encounter (HOSPITAL_COMMUNITY): Payer: Self-pay | Admitting: *Deleted

## 2013-01-05 DIAGNOSIS — M6580 Other synovitis and tenosynovitis, unspecified site: Secondary | ICD-10-CM

## 2013-01-05 DIAGNOSIS — M76891 Other specified enthesopathies of right lower limb, excluding foot: Secondary | ICD-10-CM

## 2013-01-05 HISTORY — DX: Essential (primary) hypertension: I10

## 2013-01-05 MED ORDER — TRAMADOL HCL 50 MG PO TABS: 50.0000 mg | ORAL_TABLET | Freq: Four times a day (QID) | ORAL | Status: DC | PRN

## 2013-01-05 NOTE — ED Notes (Addendum)
C/o R knee pain onset 2 weeks ago ( mostly in the back of her knee).  Pain nagging today.  She is taking Ibuprofen 400 mg. With partial relief.  When she is walking she states the pain radiates from her R thigh down to her foot.   Has noted swelling in her R foot today.  R ppp.  No known injury.

## 2013-01-05 NOTE — ED Provider Notes (Signed)
History     CSN: [id number]  Arrival date & time 01/05/13  1859   First MD Initiated Contact with Patient 01/05/13 2010      Chief Complaint  Patient presents with  . Leg Pain    (Consider location/radiation/quality/duration/timing/severity/associated sxs/prior treatment) HPI Comments: 57 year old morbidly obese female is complaining of pain in her knee. More so in the back of the knee. Upon standing she has pain from the upper anterior thigh all the way down to the foot. She denies known injury or repetitive work or movement that may have injured the knee. The pain began approximately 2 weeks ago.   Past Medical History  Diagnosis Date  . Hypertension     Past Surgical History  Procedure Laterality Date  . Abdominal hysterectomy    . Achilles tendon repair Right 2004    Family History  Problem Relation Age of Onset  . Hypertension Mother   . Diabetes Mother   . Hypertension Father   . Dementia Father   . Osteoporosis Father     History  Substance Use Topics  . Smoking status: Never Smoker   . Smokeless tobacco: Not on file  . Alcohol Use: No    OB History   Grav Para Term Preterm Abortions TAB SAB Ect Mult Living                  Review of Systems  Constitutional: Negative for fever, chills and activity change.  HENT: Negative.   Respiratory: Negative.   Cardiovascular: Negative.   Musculoskeletal:       As per HPI  Skin: Negative for color change, pallor and rash.  Neurological: Negative.     Allergies  Cortisone; Neosporin; and Prednisone  Home Medications   Current Outpatient Rx  Name  Route  Sig  Dispense  Refill  . hydrochlorothiazide (HYDRODIURIL) 25 MG tablet   Oral   Take 25 mg by mouth daily.           BP 138/74  Pulse 76  Temp(Src) 98.3 F (36.8 C) (Oral)  Resp 22  SpO2 96%  Physical Exam  Nursing note and vitals reviewed. Constitutional: She is oriented to person, place, and time. She appears well-developed and  well-nourished. No distress.  Pulmonary/Chest: Effort normal. No respiratory distress.  Musculoskeletal:  No discoloration, swelling or deformity. No tenderness to the anterior thigh, knee or lower leg. Full range of motion of the knee. No laxity. Negative drawer negative varus and valgus. Flexion 210 extension to 180. No tenderness to the thigh. The tenderness is located in the posterior knee along the hamstring tendons. Palpating the tendons produces pain that runs superiorly and inferiorly. When the right leg is extended passive dorsiflexion produces pain in the hamstring tendons. No calf tenderness. Pedal pulse 2+.  Neurological: She is alert and oriented to person, place, and time.  Skin: Skin is warm and dry. No erythema.  Psychiatric: She has a normal mood and affect.    ED Course  Procedures (including critical care time)  Labs Reviewed - No data to display No results found.   1. Tendinitis of right knee       MDM  Suspect hamstring tendinitis. No signs or symptoms of thromboembolism. Local heat. Mild stretdhes as demonstrated. Tramadol and ibuprofen for pain. For swelling, increased pain or other symptoms go to the ED.         Hayden Rasmussen, NP 01/05/13 2042

## 2013-01-07 NOTE — ED Provider Notes (Signed)
Medical screening examination/treatment/procedure(s) were performed by resident physician or non-physician practitioner and as supervising physician I was immediately available for consultation/collaboration.   Octavius Shin DOUGLAS MD.   Jeray Shugart D Jennifer Payes, MD 01/07/13 1716 

## 2013-01-11 ENCOUNTER — Other Ambulatory Visit (HOSPITAL_BASED_OUTPATIENT_CLINIC_OR_DEPARTMENT_OTHER): Payer: Self-pay | Admitting: Family Medicine

## 2013-01-11 ENCOUNTER — Ambulatory Visit (HOSPITAL_BASED_OUTPATIENT_CLINIC_OR_DEPARTMENT_OTHER)
Admission: RE | Admit: 2013-01-11 | Discharge: 2013-01-11 | Disposition: A | Discharge status: Home / Self Care | Payer: No Typology Code available for payment source | Source: Ambulatory Visit | Attending: Family Medicine | Admitting: Family Medicine

## 2013-01-11 DIAGNOSIS — M79609 Pain in unspecified limb: Secondary | ICD-10-CM | POA: Insufficient documentation

## 2013-01-11 DIAGNOSIS — Z86718 Personal history of other venous thrombosis and embolism: Secondary | ICD-10-CM | POA: Insufficient documentation

## 2013-01-11 DIAGNOSIS — G5770 Causalgia of unspecified lower limb: Secondary | ICD-10-CM

## 2013-01-11 DIAGNOSIS — M7989 Other specified soft tissue disorders: Secondary | ICD-10-CM | POA: Insufficient documentation

## 2013-01-11 DIAGNOSIS — I10 Essential (primary) hypertension: Secondary | ICD-10-CM | POA: Insufficient documentation

## 2014-05-10 ENCOUNTER — Other Ambulatory Visit: Payer: Self-pay | Admitting: Gastroenterology

## 2016-04-07 ENCOUNTER — Other Ambulatory Visit: Payer: Self-pay | Admitting: Family Medicine

## 2016-04-07 LAB — COMPREHENSIVE METABOLIC PANEL
ALT: 18 U/L (ref 0–35)
AST: 20 U/L (ref 0–35)
Albumin: 4 g/dL (ref 3.5–5.2)
Alk Phos: 124 U/L — ABNORMAL HIGH (ref 35–105)
Anion Gap: 14 ^^L (ref 7–16)
Bilirubin,Total: 0.2 mg/dL (ref 0.0–1.2)
CO2: 24 mmol/L (ref 20–28)
Calcium: 9.5 mg/dL (ref 8.6–10.2)
Chloride: 106 mmol/L (ref 96–108)
Creatinine: 0.82 mg/dL (ref 0.51–0.95)
GFR,Black: 87 mL/min/{1.73_m2}
GFR,Caucasian: 72 mL/min/{1.73_m2}
Glucose: 92 mg/dL (ref 60–99)
Lab: 12 mg/dL (ref 6–20)
Potassium: 4.2 mmol/L (ref 3.4–4.7)
Sodium: 144 mmol/L (ref 133–145)
Total Protein: 6.8 g/dL (ref 6.3–7.7)

## 2016-04-07 LAB — CBC
Hematocrit: 42.4 % (ref 34.1–44.9)
Hemoglobin: 15 g/dL (ref 11.2–15.7)
MCH: 33.2 pg — ABNORMAL HIGH (ref 25.6–32.2)
MCHC: 35.4 g/dL (ref 32.2–35.5)
MCV: 93.8 fL (ref 79.4–94.8)
Nucl RBC # K/uL: 0 /100 WBC (ref 0.0–0.2)
Platelets: 241 10*3/uL (ref 182–369)
RBC Distribution Width-SD: 46.7 fL — ABNORMAL HIGH (ref 36.4–46.3)
RBC: 4.52 10*6/uL (ref 3.93–5.22)
RDW: 13.7 % (ref 11.7–14.4)
WBC: 7.6 10*3/uL (ref 4.0–10.0)

## 2016-04-07 LAB — URIC ACID: Urate: 4.9 mg/dL (ref 2.7–6.8)

## 2016-04-07 LAB — TSH: TSH: 0.56 u[IU]/mL (ref 0.27–4.20)

## 2016-04-07 LAB — LIPID PANEL
Chol/HDL Ratio: 3.4 ^^L (ref 1.0–3.5)
Cholesterol: 210 mg/dL — ABNORMAL HIGH (ref 0–199)
HDL: 61 mg/dL — ABNORMAL HIGH (ref 40–60)
LDL Calculated: 124 mg/dL (ref 0–129)
Triglycerides: 125 mg/dL (ref 0–149)

## 2016-04-09 LAB — VITAMIN D
25-OH VIT D2: 55 ng/mL
25-OH VIT D3: 9 ng/mL
25-OH Vit Total: 64 ng/mL — ABNORMAL HIGH (ref 30–60)

## 2016-04-16 ENCOUNTER — Other Ambulatory Visit: Payer: Self-pay | Admitting: Family Medicine

## 2016-04-16 LAB — DRUG SCREEN CHEMICAL DEPENDENCY, URINE
Amphetamine,UR: NEGATIVE ^^L
Benzodiazepinen,UR: NEGATIVE ^^L
Cocaine/Metab,UR: NEGATIVE ^^L
Opiates,UR: NEGATIVE ^^L
THC Metabolite,UR: NEGATIVE ^^L

## 2016-07-30 ENCOUNTER — Ambulatory Visit: Payer: Self-pay | Admitting: Cardiology

## 2016-09-10 ENCOUNTER — Ambulatory Visit: Payer: Self-pay | Admitting: Cardiology

## 2016-09-10 ENCOUNTER — Encounter: Payer: Self-pay | Admitting: Cardiology

## 2016-09-10 VITALS — BP 130/90 | HR 90 | Ht 65.0 in | Wt 222.0 lb

## 2016-09-10 DIAGNOSIS — I351 Nonrheumatic aortic (valve) insufficiency: Secondary | ICD-10-CM

## 2016-09-10 NOTE — Progress Notes (Signed)
CARDIOLOGY CONSULTATION    Dear Dr. Carrolyn Leigh,    Today, 09/10/2016, I had the pleasure of seeing Brandi Warner in consultation for her aortic valve disease     As you know she is a 61 year old was been followed by cardiology in the [place] area for several years.  She has a history of mild aortic valve regurgitation for which she says she has been getting serial echocardiograms.  The last available 1+ dated 05/10/14 showed mild regurgitation at that time with normal LV volumes.  She denies any concerning cardiac symptoms specifically chest pain, palpitations or orthopnea PND or presyncope.  She does have class III dyspnea but continues to smoke heavily.     PAST MEDICAL HISTORY:  Past Medical History:   Diagnosis Date    Anxiety     Aortic valve disease     Arthritis     COPD (chronic obstructive pulmonary disease)     Depression     GERD (gastroesophageal reflux disease)     Gout     Hiatal hernia     Migraine     Thyroid disease        PAST SURGICAL HISTORY:  Past Surgical History:   Procedure Laterality Date    CHOLECYSTECTOMY      Cholecystectomy Conversion Data     CONVERTED PROCEDURE      Nasal Endoscopy Polypectomy Conversion Data     CONVERTED PROCEDURE      Oophorectomy Unilateral Left Side Conversion Data     OVARIAN CYST SURGERY      patient does not remember which side       MEDICATIONS:  Current Outpatient Prescriptions   Medication Sig    ALPRAZolam (XANAX) 0.25 MG tablet Take 0.25 mg by mouth 3 times daily as needed for Anxiety    ergocalciferol (DRISDOL) 50000 UNIT capsule Take 50,000 Units by mouth once a week    citalopram (CELEXA) 40 MG tablet Take 40 mg by mouth daily    esomeprazole (NEXIUM) 40 MG capsule Take 40 mg by mouth 2 times daily (with breakfast and dinner)    levothyroxine (SYNTHROID, LEVOTHROID) 75 MCG tablet Take 75 mcg by mouth daily (before breakfast)    rizatriptan (MAXALT) 10 MG tablet Take 10 mg by mouth as needed for Migraine   May repeat in 2 hours if needed.   Max 3 tabs/day.    allopurinol (ZYLOPRIM) 100 MG tablet Take 100 mg by mouth daily    zolpidem (AMBIEN) 10 MG tablet Take 10 mg by mouth nightly as needed for Sleep    aspirin 81 MG tablet Take 81 mg by mouth daily    Multiple Vitamins-Minerals (MULTIVITAMIN ADULT PO) Take by mouth daily    melatonin 3 MG Take 3 mg by mouth nightly as needed for Sleep    Cyanocobalamin (VITAMIN B 12 PO) Take by mouth daily     No current facility-administered medications for this visit.        ALLERGIES:  Allergies   Allergen Reactions    No Known Drug Allergy      Created by Conversion - 0;        SOCIAL HISTORY:   reports that she has been smoking Cigarettes.  She started smoking about 39 years ago. She has a 27.00 pack-year smoking history. She has never used smokeless tobacco. She reports that she does not drink alcohol or use illicit drugs.    FAMILY HISTORY:  family history includes COPD in her brother; Heart surgery (  age of onset: 19) in her mother; Pacemaker in her mother.    REVIEW OF SYSTEMS:  A complete 10+ ROS was reviewed & confirmed from the patient questionaire.  Pertinent positives and negatives are detailed in the HPI.    PHYSICAL EXAM:  Blood pressure 130/90, pulse 90, height 1.651 m (5\' 5" ), weight 100.7 kg (222 lb)., Body mass index is 36.94 kg/(m^2). General: Appears obese, alert, comfortable and in no acute distress.  Psychiatric: Normal affect.  Cardiac: Regular rate and rhythm with no murmurs rubs or gallops.  S1 and S2 are normal.  JVP is normal.   Pulmonary: Lungs are clear to auscultation bilaterally.  Peripheral:  There are no carotid bruits. No edema.  Skin:  Clean, dry.    EKG: Normal sinus rhythm.  Right bundle branch block, left anterior fascicular block, left atrial enlargement   LABS/DATA:       Lab results: 04/07/16  1128   Cholesterol 210*   HDL 61*   LDL Calculated 124   Triglycerides 125         Lab results: 04/07/16  1128   Sodium 144   Potassium 4.2   Chloride 106   CO2 24   UN 12      Creatinine 0.82   GFR,Caucasian 72   GFR,Black 87   Glucose 92   Calcium 9.5           Lab results: 04/07/16  1128   WBC 7.6   Hemoglobin 15.0   Hematocrit 42.4   RBC 4.52   Platelets 241       No results for input(s): HA1C in the last 8760 hours.        IMPRESSION:  1.  Aortic valve regurgitation . this was mild when last checked a few years ago.  Does not appear to be significant on exam.  Her moderate dyspnea is likely due to her obesity and tobacco abuse.  I will update an echocardiogram however to reassess LV function, valve function and pulmonary artery pressures.  Assuming this is unremarkable no further testing will be needed     2.  Tobacco Abuse.  I have counseled on the benefits of smoking cessation.(82min)    3.  HTN.   Blood pressure is adequately controlled.   We will not make any medication changes at this time.      4.  Obesity.  I have encouraged a healthy diet and exercise program for weight control and cardiovascular health.      5.  I will see her back at the time of her echocardiogram       Sincerely,    Diana Eves, M.D. [facility]  (Electronically signed)

## 2016-09-18 ENCOUNTER — Ambulatory Visit (HOSPITAL_COMMUNITY)
Admission: EM | Admit: 2016-09-18 | Discharge: 2016-09-18 | Disposition: A | Discharge status: Home / Self Care | Payer: BC Managed Care – PPO | Attending: Family Medicine | Admitting: Family Medicine

## 2016-09-18 ENCOUNTER — Encounter (HOSPITAL_COMMUNITY): Payer: Self-pay | Admitting: Family Medicine

## 2016-09-18 DIAGNOSIS — R059 Cough, unspecified: Secondary | ICD-10-CM

## 2016-09-18 DIAGNOSIS — R05 Cough: Secondary | ICD-10-CM | POA: Diagnosis not present

## 2016-09-18 DIAGNOSIS — R110 Nausea: Secondary | ICD-10-CM

## 2016-09-18 DIAGNOSIS — J40 Bronchitis, not specified as acute or chronic: Secondary | ICD-10-CM | POA: Diagnosis not present

## 2016-09-18 MED ORDER — AZITHROMYCIN 250 MG PO TABS: 250.0000 mg | ORAL_TABLET | Freq: Every day | ORAL | 0 refills | Status: DC

## 2016-09-18 MED ORDER — BENZONATATE 100 MG PO CAPS: 200.0000 mg | ORAL_CAPSULE | Freq: Three times a day (TID) | ORAL | 0 refills | Status: DC | PRN

## 2016-09-18 MED ORDER — ONDANSETRON 8 MG PO TBDP: 8.0000 mg | ORAL_TABLET | Freq: Three times a day (TID) | ORAL | 0 refills | Status: DC | PRN

## 2016-09-18 NOTE — ED Provider Notes (Signed)
CSN: [id number]     Arrival date & time 09/18/16  1208 History   First MD Initiated Contact with Patient 09/18/16 1313     Chief Complaint  Patient presents with  . Cough  . Headache  . Diarrhea   (Consider location/radiation/quality/duration/timing/severity/associated sxs/prior Treatment) Patient c/o uri sx's and cough for over 2 weeks.  She has recently developed nausea.  She states she is no longer having fever.   The history is provided by the patient.  Cough  Cough characteristics:  Productive Sputum characteristics:  White Severity:  Moderate Onset quality:  Sudden Duration:  2 weeks Timing:  Constant Progression:  Worsening Chronicity:  New Smoker: no   Context: upper respiratory infection and weather changes   Relieved by:  Nothing Worsened by:  Activity, deep breathing, environmental changes and exposure to cold air Ineffective treatments:  Rest Associated symptoms: headaches and rhinorrhea   Associated symptoms: no sore throat   Headache  Associated symptoms: cough, diarrhea and fatigue   Associated symptoms: no sore throat   Diarrhea  Associated symptoms: headaches     Past Medical History:  Diagnosis Date  . Hypertension    Past Surgical History:  Procedure Laterality Date  . ABDOMINAL HYSTERECTOMY    . ACHILLES TENDON REPAIR Right 2004  . GASTRIC BYPASS     Family History  Problem Relation Age of Onset  . Hypertension Mother   . Diabetes Mother   . Hypertension Father   . Dementia Father   . Osteoporosis Father    Social History  Substance Use Topics  . Smoking status: Never Smoker  . Smokeless tobacco: Never Used  . Alcohol use No   OB History    No data available     Review of Systems  Constitutional: Positive for fatigue.  HENT: Positive for rhinorrhea. Negative for sore throat.   Eyes: Negative.   Respiratory: Positive for cough.   Cardiovascular: Negative.   Gastrointestinal: Positive for diarrhea.  Endocrine: Negative.    Genitourinary: Negative.   Musculoskeletal: Negative.   Allergic/Immunologic: Negative.   Neurological: Positive for headaches.  Hematological: Negative.   Psychiatric/Behavioral: Negative.     Allergies  Cortisone; Neosporin [neomycin-bacitracin zn-polymyx]; and Prednisone  Home Medications   Prior to Admission medications   Medication Sig Start Date End Date Taking? Authorizing Provider  azithromycin (ZITHROMAX) 250 MG tablet Take 1 tablet (250 mg total) by mouth daily. Take first 2 tablets together, then 1 every day until finished. 09/18/16   Deatra Canter, FNP  benzonatate (TESSALON) 100 MG capsule Take 2 capsules (200 mg total) by mouth 3 (three) times daily as needed for cough. 09/18/16   Deatra Canter, FNP  hydrochlorothiazide (HYDRODIURIL) 25 MG tablet Take 25 mg by mouth daily.    Historical Provider, MD  ondansetron (ZOFRAN ODT) 8 MG disintegrating tablet Take 1 tablet (8 mg total) by mouth every 8 (eight) hours as needed for nausea or vomiting. 09/18/16   Deatra Canter, FNP  traMADol (ULTRAM) 50 MG tablet Take 1 tablet (50 mg total) by mouth every 6 (six) hours as needed for pain. 01/05/13   Hayden Rasmussen, NP   Meds Ordered and Administered this Visit  Medications - No data to display  BP 134/72   Pulse 82   Temp 98.1 F (36.7 C)   Resp 18   SpO2 97%  No data found.   Physical Exam  Constitutional: She appears well-developed and well-nourished.  HENT:  Head: Normocephalic and atraumatic.  Right  Ear: External ear normal.  Left Ear: External ear normal.  Mouth/Throat: Oropharynx is clear and moist.  Eyes: Conjunctivae and EOM are normal. Pupils are equal, round, and reactive to light.  Neck: Normal range of motion. Neck supple.  Cardiovascular: Normal rate, regular rhythm and normal heart sounds.   Pulmonary/Chest: Effort normal and breath sounds normal.  Abdominal: Soft. Bowel sounds are normal.  Nursing note and vitals reviewed.   Urgent Care Course      Procedures (including critical care time)  Labs Review Labs Reviewed - No data to display  Imaging Review No results found.   Visual Acuity Review  Right Eye Distance:   Left Eye Distance:   Bilateral Distance:    Right Eye Near:   Left Eye Near:    Bilateral Near:         MDM   1. Bronchitis   2. Cough   3. Nausea   Zofran $RemoveBefore [id number]$8mgfluids, rest, tylenol and motrin otc prn as directed for fever, arthralgias, and myalgias.  Follow up prn if sx's continue or persist.    Deatra CanterWilliam J Tenia Goh, FNP 09/18/16 1334

## 2016-09-18 NOTE — ED Triage Notes (Signed)
Pt here with cough, headache and diarrhea. sts sick since last week and had the flu.

## 2016-09-23 ENCOUNTER — Encounter: Payer: Self-pay | Admitting: Cardiology

## 2016-09-23 ENCOUNTER — Ambulatory Visit: Admit: 2016-09-23 | Discharge: 2016-09-23 | Disposition: A | Discharge status: Home / Self Care | Payer: Self-pay

## 2016-09-23 ENCOUNTER — Ambulatory Visit: Payer: Self-pay | Admitting: Cardiology

## 2016-09-23 VITALS — BP 138/80 | HR 93 | Ht 65.0 in | Wt 222.0 lb

## 2016-09-23 DIAGNOSIS — I351 Nonrheumatic aortic (valve) insufficiency: Secondary | ICD-10-CM

## 2016-09-23 LAB — ECHO COMPLETE
AR CWD Gradient (peak): 75.5 mmHg
AR Velocity (peak): 434.5 cm/s
Aortic Arch Diameter: 3 cm
Aortic Diameter (mid tubular): 3.2 cm
Aortic Diameter (sinus of Valsalva): 3.4 cm
BMI: 37 kg/m2
BP Diastolic: 80 mmHg
BP Systolic: 138 mmHg
BSA: 2.15 m2
Deceleration Time - AR: 1615.9 ms
Descending Aortic Diameter: 1.78 cm
Height: 65 in
Interventricular Septum Systolic Thickness: 1.69 cm
LA Diameter BSA Index: 1.5 cm/m2
LA Diameter Height Index: 2 cm/m
LA Diameter: 3.3 cm
LA Systolic Vol BSA Index: 11.5 mL/m2
LA Systolic Vol Height Index: 15 mL/m
LA Systolic Volume: 24.7 mL
LV ASE Mass BSA Index: 78.6 gm/m2
LV ASE Mass Height 2.7 Index: 43.6 gm/m2.7
LV ASE Mass Height Index: 102.3 gm/m
LV ASE Mass: 168.9 gm
LV Posterior Wall Thickness: 1.1 cm
LV Septal Thickness: 1.1 cm
LVED Diameter BSA Index: 2 cm/m2
LVED Diameter Height Index: 2.7 cm/m
LVED Diameter: 4.4 cm
LVES Diameter BSA Index: 1.2 cm/m2
LVES Diameter Height Index: 1.6 cm/m
LVES Diameter: 2.6 cm
Left Ventricle Posterior Wall Systolic Thickness: 1.86 cm
Pressure Half-Time - AR: 468.6 ms
RVED Diameter BSA Index: 1.3 cm/m2
RVED Diameter Height Index: 1.7 cm/m
RVED Diameter: 2.82 cm
Weight (lbs): 222 [lb_av]
Weight: 3552 oz

## 2016-09-23 NOTE — Progress Notes (Signed)
CARDIOLOGY LETTER    Dear Dr. Carrolyn Leigh,    Today, 09/23/2016, I had the pleasure of seeing Brandi Warner in followup for her shortness of breath.    Her echocardiogram today shows normal LV function.  She has mild aortic valve regurgitation but otherwise normal valve function.  PA pressures and right heart function normal.    There is nothing to suggest a cardiac cause of her dyspnea.  This is likely due to her tobacco abuse obesity and deconditioning.  I recommended that she cut back on cigarettes and focus on diet exercise and weight loss.    I will see her back in one year and update an echocardiogram at that time to track her aortic valve regurgitation.        Sincerely,    Diana Eves, M.D. [facility]  (Electronically signed)

## 2016-10-19 ENCOUNTER — Ambulatory Visit (HOSPITAL_COMMUNITY)
Admission: EM | Admit: 2016-10-19 | Discharge: 2016-10-19 | Disposition: A | Discharge status: Home / Self Care | Payer: BC Managed Care – PPO | Attending: Family Medicine | Admitting: Family Medicine

## 2016-10-19 ENCOUNTER — Encounter (HOSPITAL_COMMUNITY): Payer: Self-pay | Admitting: Emergency Medicine

## 2016-10-19 DIAGNOSIS — H60392 Other infective otitis externa, left ear: Secondary | ICD-10-CM | POA: Diagnosis not present

## 2016-10-19 MED ORDER — OFLOXACIN 0.3 % OT SOLN: 5.0000 [drp] | Freq: Two times a day (BID) | OTIC | 0 refills | Status: DC

## 2016-10-19 NOTE — ED Triage Notes (Signed)
Pt felt fullness in her on Thursday, she tried to clean the ear with a qtip and felt some drainage and pain.  She woke up on Saturday morning with blood on her pillow from her ear.  Pt has had pain in the ear for three days.

## 2016-10-19 NOTE — ED Provider Notes (Signed)
CSN: [id number]     Arrival date & time 10/19/16  1549 History   First MD Initiated Contact with Patient 10/19/16 1616     Chief Complaint  Patient presents with  . Ear Drainage  . Otalgia   (Consider location/radiation/quality/duration/timing/severity/associated sxs/prior Treatment) Patient c/o left ear discomfort.  She states she tried to clean her ear out and it started to bleed.   The history is provided by the patient.  Ear Drainage  This is a new problem. The current episode started yesterday. The problem has not changed since onset.Nothing aggravates the symptoms.  Otalgia    Past Medical History:  Diagnosis Date  . Hypertension    Past Surgical History:  Procedure Laterality Date  . ABDOMINAL HYSTERECTOMY    . ACHILLES TENDON REPAIR Right 2004  . GASTRIC BYPASS     Family History  Problem Relation Age of Onset  . Hypertension Mother   . Diabetes Mother   . Hypertension Father   . Dementia Father   . Osteoporosis Father    Social History  Substance Use Topics  . Smoking status: Never Smoker  . Smokeless tobacco: Never Used  . Alcohol use No   OB History    No data available     Review of Systems  Constitutional: Negative.   HENT: Positive for ear pain.   Eyes: Negative.   Respiratory: Negative.   Cardiovascular: Negative.   Gastrointestinal: Negative.   Endocrine: Negative.   Genitourinary: Negative.   Musculoskeletal: Negative.   Allergic/Immunologic: Negative.   Neurological: Negative.   Hematological: Negative.   Psychiatric/Behavioral: Negative.     Allergies  Cortisone; Neosporin [neomycin-bacitracin zn-polymyx]; and Prednisone  Home Medications   Prior to Admission medications   Medication Sig Start Date End Date Taking? Authorizing Provider  hydrochlorothiazide (HYDRODIURIL) 25 MG tablet Take 25 mg by mouth daily.   Yes Historical Provider, MD  ibuprofen (ADVIL,MOTRIN) 800 MG tablet Take 800 mg by mouth every 8 (eight) hours as  needed for mild pain.   Yes Historical Provider, MD  traMADol (ULTRAM) 50 MG tablet Take 1 tablet (50 mg total) by mouth every 6 (six) hours as needed for pain. 01/05/13  Yes Hayden Rasmussen, NP  azithromycin (ZITHROMAX) 250 MG tablet Take 1 tablet (250 mg total) by mouth daily. Take first 2 tablets together, then 1 every day until finished. 09/18/16   Deatra Canter, FNP  benzonatate (TESSALON) 100 MG capsule Take 2 capsules (200 mg total) by mouth 3 (three) times daily as needed for cough. 09/18/16   Deatra Canter, FNP  ofloxacin (FLOXIN) 0.3 % otic solution Place 5 drops into the left ear 2 (two) times daily. 10/19/16   Deatra Canter, FNP  ondansetron (ZOFRAN ODT) 8 MG disintegrating tablet Take 1 tablet (8 mg total) by mouth every 8 (eight) hours as needed for nausea or vomiting. 09/18/16   Deatra Canter, FNP   Meds Ordered and Administered this Visit  Medications - No data to display  BP 140/75 (BP Location: Left Arm)   Pulse 83   Temp 98.2 F (36.8 C) (Oral)   SpO2 99%  No data found.   Physical Exam  Constitutional: She appears well-developed and well-nourished.  HENT:  Head: Normocephalic and atraumatic.  Right Ear: External ear normal.  Mouth/Throat: Oropharynx is clear and moist.  Left EAC with tenderness and blood clot but no TM perforation seen and hearing intact.  Eyes: Conjunctivae and EOM are normal. Pupils are equal, round, and  reactive to light.  Neck: Normal range of motion. Neck supple.  Cardiovascular: Normal rate, regular rhythm and normal heart sounds.   Pulmonary/Chest: Breath sounds normal.  Nursing note and vitals reviewed.   Urgent Care Course     Procedures (including critical care time)  Labs Review Labs Reviewed - No data to display  Imaging Review No results found.   Visual Acuity Review  Right Eye Distance:   Left Eye Distance:   Bilateral Distance:    Right Eye Near:   Left Eye Near:    Bilateral Near:         MDM   1. Other  infective acute otitis externa of left ear    Ofloxacin Ear Gtt's 5 gtt's AS bid #7.595ml  Take tylenol and motrin otc as directed      Deatra CanterWilliam J Kerrilyn Azbill, FNP 10/19/16 1655

## 2016-11-12 ENCOUNTER — Telehealth: Payer: Self-pay | Admitting: Gastroenterology

## 2016-11-12 NOTE — Telephone Encounter (Signed)
Pre procedure call made to [name] for EGD/Colonoscopy scheduled on 11/15/2016 1000 with Dr. Tonette Lederer. Informed patient to arrive at department at [phone]             The following information was confirmed:     1. Patient's arrival time and date   2. Parking information/location given to patient  3. Directions to department given  4. Patient to bring the following: Insurance card, Photo ID, Pre procedure medication list, Health History form  5. Any further questions, instructed to reach [place] at [phone], Monday thru Friday, 7:30am to 5:00pm.      Your driver that will stay with you and drive you home when discharged. Driver:Spouse    6. Does patient have a pacemaker?  No      7. Does patient take any blood thinners, blood pressure medication or insulin? No

## 2016-11-15 ENCOUNTER — Encounter
Admission: RE | Disposition: A | Discharge status: Home / Self Care | Payer: Self-pay | Source: Ambulatory Visit | Attending: Gastroenterology

## 2016-11-15 ENCOUNTER — Ambulatory Visit
Admission: RE | Admit: 2016-11-15 | Discharge: 2016-11-15 | Disposition: A | Discharge status: Home / Self Care | Payer: No Typology Code available for payment source | Source: Ambulatory Visit | Attending: Gastroenterology | Admitting: Gastroenterology

## 2016-11-15 DIAGNOSIS — D122 Benign neoplasm of ascending colon: Secondary | ICD-10-CM | POA: Insufficient documentation

## 2016-11-15 DIAGNOSIS — Z1211 Encounter for screening for malignant neoplasm of colon: Secondary | ICD-10-CM | POA: Insufficient documentation

## 2016-11-15 DIAGNOSIS — K635 Polyp of colon: Secondary | ICD-10-CM | POA: Insufficient documentation

## 2016-11-15 DIAGNOSIS — K2270 Barrett's esophagus without dysplasia: Secondary | ICD-10-CM | POA: Insufficient documentation

## 2016-11-15 DIAGNOSIS — K449 Diaphragmatic hernia without obstruction or gangrene: Secondary | ICD-10-CM | POA: Insufficient documentation

## 2016-11-15 DIAGNOSIS — Z86010 Personal history of colonic polyps: Secondary | ICD-10-CM | POA: Insufficient documentation

## 2016-11-15 DIAGNOSIS — K5730 Diverticulosis of large intestine without perforation or abscess without bleeding: Secondary | ICD-10-CM | POA: Insufficient documentation

## 2016-11-15 DIAGNOSIS — K648 Other hemorrhoids: Secondary | ICD-10-CM | POA: Insufficient documentation

## 2016-11-15 HISTORY — DX: Barrett's esophagus without dysplasia: K22.70

## 2016-11-15 SURGERY — EGD (ESOPHAGOGASTRODUODENOSCOPY)

## 2016-11-15 MED ORDER — MIDAZOLAM HCL 5 MG/5ML IJ SOLN *I*
INTRAMUSCULAR | Status: AC | PRN
Start: 2016-11-15 — End: 2016-11-15
  Administered 2016-11-15 (×2): 1 mg via INTRAVENOUS
  Administered 2016-11-15 (×2): 2 mg via INTRAVENOUS

## 2016-11-15 MED ORDER — BENZOCAINE 20 % MT SOLN WRAPPED *I*
1.0000 | Freq: Once | OROMUCOSAL | Status: AC
Start: 2016-11-15 — End: 2016-11-15
  Administered 2016-11-15: 1 via OROMUCOSAL

## 2016-11-15 MED ORDER — SODIUM CHLORIDE 0.9 % IV SOLN WRAPPED *I*
50.0000 mL/h | Status: DC
Start: 2016-11-15 — End: 2016-11-15

## 2016-11-15 MED ORDER — INDIA INK *I*
Status: AC | PRN
Start: 2016-11-15 — End: 2016-11-15
  Administered 2016-11-15: 6 mL via INTRADERMAL

## 2016-11-15 MED ORDER — MIDAZOLAM HCL 5 MG/5ML IJ SOLN *I*
INTRAMUSCULAR | Status: AC
Start: 2016-11-15 — End: 2016-11-15
  Filled 2016-11-15: qty 10

## 2016-11-15 MED ORDER — INDIA INK *I*
Status: AC
Start: 2016-11-15 — End: 2016-11-15
  Filled 2016-11-15: qty 10

## 2016-11-15 MED ORDER — SODIUM CHLORIDE 0.9 % IV SOLN WRAPPED *I*
30.0000 mL/h | Status: DC
Start: 2016-11-15 — End: 2016-11-15
  Administered 2016-11-15: 30 mL/h via INTRAVENOUS

## 2016-11-15 MED ORDER — FENTANYL CITRATE 50 MCG/ML IJ SOLN *WRAPPED*
INTRAMUSCULAR | Status: AC | PRN
Start: 2016-11-15 — End: 2016-11-15
  Administered 2016-11-15 (×2): 25 ug via INTRAVENOUS
  Administered 2016-11-15: 50 ug via INTRAVENOUS

## 2016-11-15 MED ORDER — FENTANYL CITRATE 50 MCG/ML IJ SOLN *WRAPPED*
INTRAMUSCULAR | Status: AC
Start: 2016-11-15 — End: 2016-11-15
  Filled 2016-11-15: qty 2

## 2016-11-15 SURGICAL SUPPLY — 18 items
CLIP HEMOSTASIS ENDO INSTINCT 7FRX230CM (Supply) ×2 IMPLANT
ELECTRODE ADULT POLYHESIVE PAT RETURN (Supply) IMPLANT
FORCEPS BIOPSY RADIAL JAW LG (Other) ×2 IMPLANT
FORCEPS DISPOSABLE ALLIGATOR JAW W/NEEDLE (Supply) IMPLANT
NEEDLE CARR LOCKE INJ USE 241899 (Needle) ×2 IMPLANT
PROBE CIRCUMFERENTAIL FIRE (Supply) IMPLANT
PROBE COAG 2000A 6.9FR L2.2M INTEGR STYL W/ FLTR FOR FLX ENDOSCP INTERVENTION FIAPC (Supply) IMPLANT
PROBE COAG 6.9FR L2.2M CNVNENT BLT IN FLTR 2200SC SIMPLIFIED SETUP PLUG PLAY FUNCTIONALITY (Supply) IMPLANT
PROBE SIDE FIRE (Supply)
PROBE STRAIGHT FIRE (Supply)
RETRIEVER ROTH NET FOREIGN BO USE 241906 (Supply) IMPLANT
SNARE MICRO MINI (Supply)
SNARE MINI SOFT ACU 1.5 X 3 (Supply) IMPLANT
SNARE POLYP 1CMX1.5CM SFT GI MINI MIC OVL ACUSNR (Supply) IMPLANT
SNARE SOFT JUMBO ENDO DISPO (Supply) IMPLANT
SNARE STANDARD SOFT ACU 2.5 X 5.5 (Supply) ×2 IMPLANT
TRAP POLYP MULTICHAMBER DISP OPTIMIZER (Other) ×1 IMPLANT
TRAPS POLY (Other) ×1

## 2016-11-15 NOTE — Preop H&P (Signed)
OUTPATIENT PRE-PROCEDURE H&P    Chief Complaint / Indications for Procedure: Surveillance colonoscopy and Dyspepsia  Dysphagia    Past Medical History:     Past Medical History:   Diagnosis Date    Anxiety     Aortic valve disease     Arthritis     Barrett's esophagus     COPD (chronic obstructive pulmonary disease)     Depression     GERD (gastroesophageal reflux disease)     Gout     Hiatal hernia     Migraine     Thyroid disease      Past Surgical History:   Procedure Laterality Date    CHOLECYSTECTOMY      Cholecystectomy Conversion Data     CONVERTED PROCEDURE      Nasal Endoscopy Polypectomy Conversion Data     CONVERTED PROCEDURE      Oophorectomy Unilateral Left Side Conversion Data     OVARIAN CYST SURGERY      patient does not remember which side     Family History   Problem Relation Age of Onset    Pacemaker Mother     Heart surgery Mother 54     3 bypass    COPD Brother      Social History     Social History    Marital status: Married     Spouse name: N/A    Number of children: N/A    Years of education: N/A     Social History Main Topics    Smoking status: Current Every Day Smoker     Packs/day: 1.00     Years: 27.00     Types: Cigarettes     Start date: 09/10/1977    Smokeless tobacco: Never Used    Alcohol use No    Drug use: No    Sexual activity: Not Asked     Other Topics Concern    None     Social History Narrative       Allergies:    Allergies   Allergen Reactions    No Known Drug Allergy      Created by Conversion - 0;        Medications:  Prescriptions Prior to Admission   Medication Sig    rizatriptan (MAXALT) 10 MG tablet Take 10 mg by mouth as needed for Migraine   May repeat in 2 hours if needed.  Max 3 tabs/day.    allopurinol (ZYLOPRIM) 100 MG tablet Take 100 mg by mouth daily    melatonin 3 MG Take 3 mg by mouth nightly as needed for Sleep    ALPRAZolam (XANAX) 0.25 MG tablet Take 0.25 mg by mouth 3 times daily as needed for Anxiety    ergocalciferol  (DRISDOL) 50000 UNIT capsule Take 50,000 Units by mouth once a week    citalopram (CELEXA) 40 MG tablet Take 40 mg by mouth daily    esomeprazole (NEXIUM) 40 MG capsule Take 40 mg by mouth 2 times daily (with breakfast and dinner)    levothyroxine (SYNTHROID, LEVOTHROID) 75 MCG tablet Take 75 mcg by mouth daily (before breakfast)    zolpidem (AMBIEN) 10 MG tablet Take 10 mg by mouth nightly as needed for Sleep    aspirin 81 MG tablet Take 81 mg by mouth daily    Multiple Vitamins-Minerals (MULTIVITAMIN ADULT PO) Take by mouth daily    Cyanocobalamin (VITAMIN B 12 PO) Take by mouth daily     Current Facility-Administered Medications  Medication Dose Route Frequency    sodium chloride 0.9 % IV  30 mL/hr Intravenous Continuous    benzocaine (HURRICAINE) 20 % mouth spray 1 spray  1 spray Mouth/Throat Once    sodium chloride 0.9 % IV  50 mL/hr Intravenous Continuous     No current outpatient prescriptions on file.     Vitals:    11/15/16 0906   BP: 139/88   Pulse: 92   Resp: 16   Temp: 36.8 C (98.2 F)   Weight: 99.4 kg (219 lb 3.2 oz)   Height: 165.1 cm ('5\' 5"' )           Physical Examination:Oropharynx: mucosa pink and moist  Lungs: percussion normal, good diaphragmatic excursion, lungs clear to auscultation bilaterally  Heart: regular rate and rhythm, no murmurs, gallops or rubs  Abdomen: abdomen soft, non-tender, nondistended, normal active bowel sounds, no masses or organomegaly  Neuro: No focal neurology  Extremities/Musculoskeletal: No rash,cyanosis,or edema noted       Last Lab Results:   Lab Results   Component Value Date/Time    WBC 7.6 04/07/2016 11:28 AM    HCT 42.4 04/07/2016 11:28 AM    PLT 241 04/07/2016 11:28 AM    AST 20 04/07/2016 11:28 AM    ALT 18 04/07/2016 11:28 AM    ALK 124 (H) 04/07/2016 11:28 AM    TB 0.2 04/07/2016 11:28 AM    NA 144 04/07/2016 11:28 AM    K 4.2 04/07/2016 11:28 AM    UN 12 04/07/2016 11:28 AM    CREAT 0.82 04/07/2016 11:28 AM         Radiology impressions (last 30  days):  No results found.    Currently Active Problems:  Patient Active Problem List   Diagnosis Code    Hypothyroidism E03.9        Impression:  Surveillance colonoscopy and Dyspepsia  Dysphagia    Plan:  EGD and Colonoscopy  Moderate Sedation    UPDATES TO PATIENT'S CONDITION on the DAY OF SURGERY/PROCEDURE    I. Updates to Patient's Condition (to be completed by a provider privileged to complete a H&P, following reassessment of the patient by the provider):    Full H&P done today; no updates needed.    II. Procedure Readiness   I have reviewed the patient's H&P and updated condition. By completing and signing this form, I attest that this patient is ready for surgery/procedure.      III. Attestation   I have reviewed the updated information regarding the patient's condition and it is appropriate to proceed with the planned surgery/procedure.    Tonette Lederer, MD as of 9:48 AM 11/15/2016   ]

## 2016-11-15 NOTE — Procedures (Signed)
Procedure Report    Endoscopic Gastroduodenoscopy Procedure Note    Procedure: Endoscopic Gastroduodenoscopy --diagnostic, with biopsy    Pre-operative Diagnosis: dysphagia, heartburn    Post-operative Diagnosis: See below    Sedation: Versed 4 mg IV, fentanyl 75 mcg IV, topical Hurricaine spray    Pre-Procedure Physical:  Patient's medications, allergies, past medical, surgical, social and family histories were reviewed and updated as appropriate.    BP 139/88 (BP Location: Right arm)   Pulse 92   Temp 36.8 C (98.2 F) (Temporal)    Resp 16   Ht 165.1 cm (5\' 5" )   Wt 99.4 kg (219 lb 3.2 oz)   BMI 36.48 kg/m2    Airway:  normal  Heart:  normal S1 and S2  Lungs:  clear  Abdomen:  soft, nontender, normal bowel sounds  Mental Status:  awake and alert; oriented to person, place, and time      ASA Class: 2    Procedure Details     Informed consent was obtained for the procedure, including conscious sedation. Risks of pancreatitis, infection, perforation, hemorrhage, adverse drug reaction and aspiration were discussed. The patient was placed in the left lateral decubitus position.  Based on the pre-procedure assessment, including review of the patient's medical history, medications, allergies, and review of systems, she had been deemed to be an appropriate candidate for conscious sedation; she was therefore sedated with the medications listed below.  She was monitored continuously with ECG tracing, pulse oximetry, blood pressure monitoring, and direct observation.      The gastroscope was inserted into the mouth and advanced under direct vision to second portion of the duodenum.  A careful inspection was made as the gastroscope was withdrawn, including a retroflexed view of the proximal stomach; findings and interventions are described below. Appropriate photodocumentation was obtained.    Findings:   1. Esophagus normal. Z-line mildly irregular at 35 cm from incisors.  Cold forceps biopsies obtained from the distal and  proximal esophagus.  2. 2 cm hiatal hernia.  3. Stomach showed mild erythema throughout.  Cold forceps biopsies obtained.  4. Duodenum normal.    Specimens: gastric antrum, distal esophagus, proximal esophagus           Complications:  None; patient tolerated the procedure well.           Disposition: Recovery           Condition: stable    Attending Attestation: I performed the procedure.    Impression:    1. Esophagus normal. Z-line mildly irregular at 35 cm from incisors.  Cold forceps biopsies obtained from the distal and proximal esophagus.  2. 2 cm hiatal hernia.  3. Stomach showed mild erythema throughout.  Cold forceps biopsies obtained.  4. Duodenum normal.    Recommendations:   1. Await pathology.  2. Anti-reflux measures.  3. Avoid NSAIDs.  4. Smoking cessation and weight loss suggested.  5. Continue nexium 40 mg PO BID and ranitidine 300 mg QHS.    Tonette Lederer, MD  Cell:  [phone]   Office Phone:  [phone]

## 2016-11-15 NOTE — Procedures (Signed)
Procedure Report  [facility] Colonoscopy Procedure Note     Date of Procedure: 11/15/2016   Primary Physician: Georgana Curio, MD        Attending Physician: Joneen Caraway. Jone Baseman, M.D.      Indications: Surveillance for history of colonic polyps  Previous Colonoscopy:  Yes -- Date: 2014  Medications:Fentanyl 25 mcg IV and Midazolam 2 mg IV were administered incrementally over the course of the procedure to achieve an adequate level of conscious sedation.  Please see EGD report prior for additional medication.    Informed Consent: Prior to the procedure, the patient was explained the risk, benefits and alternatives including the risk of bleeding, infection, colon perforation, splenic injury or perforation and informed consent was obtained.     Procedure Details: The patient was placed in the left lateral decubitus position and monitored continuously with ECG tracing, pulse oximetry, blood pressure monitoring and direct observations. After anorectal examination was performed, the Olympus PCF-H180was inserted into the rectum and advanced under direct vision to the cecum, which was identified by  the ileocecal valve and the appendiceal orifice. The procedure was considered more difficult than average. Additional maneuvers used: repositioned patient on back, repositioned patient on abdomen and applied abdominal pressure.    Bowel Preparation:  During withdrawal examination, the final quality of the prep was [facility] Bowel Prep Scale   Right Colon: Grade3- (entire mucosa of colon segment seen well, with no residual staining, small fragments of stool, or opaque liguid)    Transverse Colon: Grade 3- (entire mucosa of colon segment seen well, with no residual staining, small fragments of stool, or opaque liguid)    Left Colon: Grade 3- (entire mucosa of colon segment seen well, with no residual staining, small fragments of stool, or opaque liguid)    Narrative:  A careful inspection was made as the colonoscope was withdrawn,  a retroflexed view of the rectum was included; findings and interventions are described below. Appropriate photo documentation wasobtained. The patient  recovered in the [facility].  PCF-H180AL ([id number])  Insertion Time: 1013  Cecum Time: 1031  Exit Time: 2694    Findings:   Terminal Ileum: Not visualized.  Colon: There was mild sigmoid diverticulosis.  There was a sessile 10 mm ascending colon polyp removed in a piecemeal fashion with cold snare and closed with an endoclip.  6 cc of [place] Ink was injected in a three quadrant fashion at the polypectomy site for future reference.  There were multiple sigmoid colon polyps ranging in size from 4-6 mm in size; 8 of the polyps were removed with cold snare.  Small internal hemorrhoids.    Complications: The patient tolerated the procedure well.    Impression:   There was mild sigmoid diverticulosis.    There was a sessile 10 mm ascending colon polyp removed in a piecemeal fashion with cold snare and closed with an endoclip.  6 cc of [place] Ink was injected in a three quadrant fashion at the polypectomy site for future reference.    There were multiple sigmoid colon polyps ranging in size from 4-6 mm in size; 8 of the polyps were removed with cold snare.    Small internal hemorrhoids.  Recommendations:   1. Await pathology  2. Repeat colonoscopy in 6 months to evaluate polypectomy site in the ascending colon.  3. Avoid NSAIDs for 14 days.  4. Avoid MRI for 4 weeks.    Tonette Lederer, MD  Cell: [phone]   Office Phone #  [phone]        Moderate Sedation Face Times  Start Time: 1740

## 2016-11-15 NOTE — Discharge Instructions (Signed)
[place]  Discharge Instructions  for Gastroscopy    11/15/2016                10:09 AM    Findings: EGD and Biopsies taken    Do not drive, operate heavy machinery, drink alcoholic beverages, make important personal or business decisions, or sign legal documents until next day.    Instructions:  Follow anti-reflux measures., Avoid taking aspirin, Advil, Motrin or other anti-inflammatory medications., You may take Tylenol (Acetaminophen). and  Continue nexium and ranitidine.  Avoid smoking.  Weight loss efforts may improve symptoms.    GASTROESOPHAGEAL REFLUX DISEASE  LIFESTYLE AND DIETARY MODIFICATIONS    Your doctor has determined that your symptoms may be due to acid reflux from your stomach into your esophagus.  The following measures are very helpful in reducing acid reflux.  Although your doctor may feel that the relief of your symptoms requires medication, following these measures can greatly enhance the effectiveness of the medications.  Often, this means that lower doses of the medication may be used for shorter periods of time.    1. Raise the head of your bed by 6 inches.  Sleeping on a slight incline will allow gravity to help drain acid and other harmful secretions from your esophagus back down into your stomach as you sleep.    2. Stop Smoking!  The nicotine in cigarette smoke directly stimulates acid secretion as well as loosening the muscles that guard the esophagus against acid reflux.  Smoking will make your symptoms much harder to control with usual doses of medication.  If any inflammation or ulcers have been caused by the acid reflux, these will be much harder to heal while you are smoking.    3. Avoid alcohol.  Drinking any beer, wine or liquor paralyzes the muscles that guard the esophagus against acid reflux. And will make it more difficult to control your symptoms, and make the medications less effective.    4. Avoid caffeine.    The caffeine found in coffee, tea,  chocolate and most sodas stimulates acid secretion  and also paralyzes the muscles that help guard the esophagus.  Once again, this will make it harder to control your symptoms, and may make the medications less effective.  Decaffeinated beverages should be acceptable.    5. Avoid carbonation.    Carbonated beverages such as soda or mineral water will increase gas pressure inside the stomach, making it much more likely for acid to reflux into the esophagus.    6. Avoid mints.  Spearmint, peppermint ,mint candies, and mint herbal teas are known as carminatives and act to weaken the muscles that guard the esophagus against acid reflux.    7. Avoid tight clothing  Tight belts, girdles, or other restrictive clothing will increase the pressure on your stomach helping to force acid from the stomach into the esophagus.          8. Never lie down or recline immediately after eating.  When your stomach is full of food, a large amount of stomach acid will be secreted.  For this reason, it is best to remain in an upright position for at least 3 hours following meals.  Having a late dinner or late night snack before going to sleep is very bad for your condition.    9. What about spicy foods?  Your esophagus may additionally be sensitive to certain foods, such as spices, or acidic foods such as orange juice, tomatoes or peppers.  However, these items are not the cause of your condition, and if they do not bother you, then there is no special reason to avoid them.      10. Pills.  Always drink a glass of water when taking medications, especially at bedtime.  Certain medications will become stuck in the esophagus if not swallowed with enough water.  Medications which contain aspirin, Motrin, iron, tetracycline, quinidine, or vitamin C (to name a few) may cause burns and injury to the esophagus if not swallowed with enough water.    11. Weight.  Your doctor will advise you if modest weight loss is recommended.    Things You May  Expect:   A mild sore throat (Warm liquids or lozenges will soothe the throat).   You were given medication to help you relax during the test. You need to rest at home for at least 4-6 hours.    You Should Call Your Doctor For Any Of The Following:   Fever, abdominal or chest pain that does not go away.   Cough or trouble breathing.   Bloody or black stools.   Pain or redness at the IV site.    If you have a serious problem after hours. Call Dr. Tonette Lederer [phone] to reach the GI physician on call.  If you are unable to reach your doctor, go to the [facility] Emergency Department.    Follow Up Care:  Call your doctor for biopsy results in 14 days.  A letter will be sent to you with the biopsy results in 14 days.  Report will be sent to your primary doctor.    [place]  Discharge Instructions  For Colonoscopy    11/15/2016    10:54 AM    Findings:Polyps removed and Diverticulosis    Do not drive, operate heavy machinery, drink alcoholic beverages, make important personal or business decisions, or sign legal documents until next day.     Return to your usual diet.   Do not take aspirin, Advil, Motrin, or other anti-inflammatory medications for 14 days.   Do not do any heavy work/lifting for 7 days.    Avoid MRI for 4 weeks secondary to a metal clip placed in your colon.    Things you May Expect:   A small amount of bright red blood in your stool   It may be a few days before you have a bowel movement   You may have cramping, bloating, and feeling of "gas". These feelings should go away as you pass gas. If you still feel uncomfortable, walking around will help to pass the gas.   You were given medication to help you relax during the test. You may feel "fuzzy" and drowsy. Go home and rest for at least 4-6 hours.    You Should Call You Doctor For any of the Following:     Bad stomach pain    Fever    Bright red bleeding or clots (This may happen up to 2 weeks after the  test.)    Dizziness or weakness that gets worse or last up to 24 hours.    Pain or redness at the IV site    If you have a serious problem after hours, Call [phone] to reach the GI physician on call. If you are unable to reach your doctor, go to the [facility] Emergency Department.    Follow Up Care:  > If biopsies were taken a letter will be sent to you  with the results within 7-10 days. If you do not receive a report after 2 weeks please call the office at [phone].  > Report will be sent to your primary doctor.    Call Dr. Chesley Noon office tel # [phone] if you have any further problems or concerns.      Repeat colonoscopy in 6 months to evaluate polyp site.

## 2016-11-17 LAB — SURGICAL PATHOLOGY

## 2017-05-06 ENCOUNTER — Telehealth: Payer: Self-pay | Admitting: Gastroenterology

## 2017-05-06 NOTE — Telephone Encounter (Signed)
·   Pre procedure call made to Brandi Warner for colonoscopy scheduled on 05/09/2017 with Dr. Tonette Lederer. Informed patient to arrive at department at 945AM    A message was left confirming the following information:      1. Patient's arrival time and date   2. Your driver must stay with you and drive you home when discharged  3. Parking information/location given to patient  4. Directions to department given  5. Patient to bring the following: Insurance card, Photo ID, Pre procedure medication list, Health History form  6. If patient has a pacemaker, please bring along the pacemaker ID card  7. If patient is taking any blood thinner, blood pressure medication or insulin your doctors office should of given you instructions regarding these medications. If not you should contact the office immediately.   8. Any further questions, instructed to reach [place] at [phone], Monday thru Friday, 7:30am to 5:00pm.

## 2017-05-09 ENCOUNTER — Ambulatory Visit
Admission: RE | Admit: 2017-05-09 | Discharge: 2017-05-09 | Disposition: A | Discharge status: Home / Self Care | Payer: No Typology Code available for payment source | Source: Ambulatory Visit | Attending: Gastroenterology | Admitting: Gastroenterology

## 2017-05-09 ENCOUNTER — Encounter
Admission: RE | Disposition: A | Discharge status: Home / Self Care | Payer: Self-pay | Source: Ambulatory Visit | Attending: Gastroenterology

## 2017-05-09 DIAGNOSIS — Z86010 Personal history of colonic polyps: Secondary | ICD-10-CM | POA: Insufficient documentation

## 2017-05-09 DIAGNOSIS — K5730 Diverticulosis of large intestine without perforation or abscess without bleeding: Secondary | ICD-10-CM | POA: Insufficient documentation

## 2017-05-09 DIAGNOSIS — D128 Benign neoplasm of rectum: Secondary | ICD-10-CM | POA: Insufficient documentation

## 2017-05-09 DIAGNOSIS — Z09 Encounter for follow-up examination after completed treatment for conditions other than malignant neoplasm: Secondary | ICD-10-CM | POA: Insufficient documentation

## 2017-05-09 DIAGNOSIS — D122 Benign neoplasm of ascending colon: Secondary | ICD-10-CM | POA: Insufficient documentation

## 2017-05-09 DIAGNOSIS — K635 Polyp of colon: Secondary | ICD-10-CM | POA: Insufficient documentation

## 2017-05-09 DIAGNOSIS — K648 Other hemorrhoids: Secondary | ICD-10-CM | POA: Insufficient documentation

## 2017-05-09 DIAGNOSIS — D125 Benign neoplasm of sigmoid colon: Secondary | ICD-10-CM | POA: Insufficient documentation

## 2017-05-09 HISTORY — DX: Polyp of colon: K63.5

## 2017-05-09 SURGERY — COLONOSCOPY

## 2017-05-09 MED ORDER — FENTANYL CITRATE 50 MCG/ML IJ SOLN *WRAPPED*
INTRAMUSCULAR | Status: AC | PRN
Start: 2017-05-09 — End: 2017-05-09
  Administered 2017-05-09 (×2): 50 ug via INTRAVENOUS

## 2017-05-09 MED ORDER — MIDAZOLAM HCL 5 MG/5ML IJ SOLN *I*
INTRAMUSCULAR | Status: AC
Start: 2017-05-09 — End: 2017-05-09
  Filled 2017-05-09: qty 10

## 2017-05-09 MED ORDER — FENTANYL CITRATE 50 MCG/ML IJ SOLN *WRAPPED*
INTRAMUSCULAR | Status: AC
Start: 2017-05-09 — End: 2017-05-09
  Filled 2017-05-09: qty 2

## 2017-05-09 MED ORDER — INDIA INK *I*
Status: AC | PRN
Start: 2017-05-09 — End: 2017-05-09
  Administered 2017-05-09: 9 mL via INTRADERMAL

## 2017-05-09 MED ORDER — MIDAZOLAM HCL 5 MG/5ML IJ SOLN *I*
INTRAMUSCULAR | Status: AC | PRN
Start: 2017-05-09 — End: 2017-05-09
  Administered 2017-05-09: 1 mg via INTRAVENOUS
  Administered 2017-05-09 (×2): 2 mg via INTRAVENOUS
  Administered 2017-05-09: 1 mg via INTRAVENOUS

## 2017-05-09 MED ORDER — LIDOCAINE HCL 1 % IJ SOLN *I*
0.1000 mL | INTRAMUSCULAR | Status: DC | PRN
Start: 2017-05-09 — End: 2017-05-09

## 2017-05-09 MED ORDER — SODIUM CHLORIDE 0.9 % IV SOLN WRAPPED *I*
50.0000 mL/h | Status: DC
Start: 2017-05-09 — End: 2017-05-09
  Administered 2017-05-09: 50 mL/h via INTRAVENOUS

## 2017-05-09 MED ORDER — INDIA INK *I*
Status: AC
Start: 2017-05-09 — End: 2017-05-09
  Filled 2017-05-09: qty 10

## 2017-05-09 SURGICAL SUPPLY — 17 items
CLIP HEMOSTASIS ENDO INSTINCT 7FRX230CM (Supply) IMPLANT
ELECTRODE ADULT POLYHESIVE PAT RETURN (Supply) IMPLANT
FORCEPS BIOPSY RADIAL JAW LG (Other) ×2 IMPLANT
FORCEPS DISPOSABLE ALLIGATOR JAW W/NEEDLE (Supply) IMPLANT
NEEDLE CARR LOCKE INJ USE 241899 (Needle) ×2 IMPLANT
PROBE CIRCUMFERENTAIL FIRE (Supply) IMPLANT
PROBE COAG 2000A 6.9FR L2.2M INTEGR STYL W/ FLTR FOR FLX ENDOSCP INTERVENTION FIAPC (Supply) IMPLANT
PROBE COAG 6.9FR L2.2M CNVNENT BLT IN FLTR 2200SC SIMPLIFIED SETUP PLUG PLAY FUNCTIONALITY (Supply) IMPLANT
PROBE SIDE FIRE (Supply)
PROBE STRAIGHT FIRE (Supply)
SNARE MICRO MINI (Supply)
SNARE MINI SOFT ACU 1.5 X 3 (Supply) ×2 IMPLANT
SNARE POLYP 1CMX1.5CM SFT GI MINI MIC OVL ACUSNR (Supply) IMPLANT
SNARE SOFT JUMBO ENDO DISPO (Supply) IMPLANT
SNARE STANDARD SOFT ACU 2.5 X 5.5 (Supply) IMPLANT
TRAP POLYP MULTICHAMBER DISP OPTIMIZER (Other) ×1 IMPLANT
TRAPS POLY (Other) ×1

## 2017-05-09 NOTE — Procedures (Signed)
Procedure Report    [facility] Colonoscopy Procedure Note     Date of Procedure: 05/09/2017   Primary Physician: Georgana Curio, MD        Attending Physician: Joneen Caraway. Jone Baseman, M.D.      Indications: Surevillance of large polypectomy site in teh ascending colon  Previous Colonoscopy:  Yes -- Date: 10/2016  Medications:Fentanyl 100 mcg IV and Midazolam 6 mg IV were administered incrementally over the course of the procedure to achieve an adequate level of conscious sedation.     Informed Consent: Prior to the procedure, the patient was explained the risk, benefits and alternatives including the risk of bleeding, infection, colon perforation, splenic injury or perforation and informed consent was obtained.     Procedure Details: The patient was placed in the left lateral decubitus position and monitored continuously with ECG tracing, pulse oximetry, blood pressure monitoring and direct observations. After anorectal examination was performed, the Olympus PCF-H180was inserted into the rectum and advanced under direct vision to the cecum, which was identified by  the ileocecal valve. The procedure was considered more difficult than average. Additional maneuvers used: applied abdominal pressure.    Bowel Preparation:  During withdrawal examination, the final quality of the prep was [facility] Bowel Prep Scale   Right Colon: Grade2- (minor amount of residual staining, small fragments of stool, and/or opaque liquid, but mucosa of colon segment is seen well)    Transverse Colon: Grade 2- (minor amount of residual staining, small fragments of stool, and/or opaque liquid, but mucosa of colon segment is seen well)    Left Colon: Grade 2- (minor amount of residual staining, small fragments of stool, and/or opaque liquid, but mucosa of colon segment is seen well)    Narrative:  A careful inspection was made as the colonoscope was withdrawn, a retroflexed view of the rectum was included; findings and interventions are described  below. Appropriate photo documentation wasobtained. The patient  recovered in the [facility].  Estimated Blood Loss:  There was no significant blood loss during the procedure.  PCF-PH190L ([id number])  Insertion Time: 1028  Cecum Time: 1035  Exit Time: 1054    Findings:   Terminal Ileum: Not visualized secondary to looping of the colonoscope.  Colon: The cecal base was not well visualized secondary to inadequate bowel preparation.  There was a previously placed tattoo in the ascending colon from polypectomy.  Cold forceps biopsies obtained from residual nodular tissue at this site.  There were four small sessile sigmoid colon polyps ranging from 3-5 mm in size; removed by a combination of cold snare and cold forceps.  There was mild diverticulosis throughout the colon.  There were four small sessile rectal polyps measuring 3-4 mm in size remove with cold forceps.  There was a large sessile 2 cm rectal polyp s/p cold forceps biopsies.  [place] Ink 10 cc was injected to mark this site for future colonoscopy with complete polypectomy.  Small internal hemorrhoids.    Complications: The patient tolerated the procedure well.    Impression:   The cecal base was not well visualized secondary to inadequate bowel preparation.  There was a previously placed tattoo in the ascending colon from polypectomy.  Cold forceps biopsies obtained from residual nodular tissue at this site.  There were four small sessile sigmoid colon polyps ranging from 3-5 mm in size; removed by a combination of cold snare and cold forceps.  There was mild diverticulosis throughout the colon.  There were four small sessile  rectal polyps measuring 3-4 mm in size remove with cold forceps.  There was a large sessile 2 cm rectal polyp s/p cold forceps biopsies.  [place] Ink 10 cc was injected to mark this site for future colonoscopy with complete polypectomy.  Small internal hemorrhoids.  Recommendations:   1. Await pathology  2. Resume previous  diet.  3. Avoid NSAIDs for 10 days.  4. Will refer to Dr. Vickie Epley in [place] for large rectal polyp resection if pathology suggests adenoma.  5. Smoking cessation counseling provided.    Tonette Lederer, MD  Cell: [phone]   Office Phone # [phone]

## 2017-05-09 NOTE — Discharge Instructions (Signed)
[place]  Discharge Instructions  For Colonoscopy    05/09/2017    11:03 AM    Findings:Polyps removed and Diverticulosis    Do not drive, operate heavy machinery, drink alcoholic beverages, make important personal or business decisions, or sign legal documents until next day.     Return to your usual diet.   Do not take aspirin, Advil, Motrin, or other anti-inflammatory medications for 7 days.   No travel outside the area for 7 days.   Do not do any heavy work/lifting for 7 days.    Things you May Expect:   A small amount of bright red blood in your stool   It may be a few days before you have a bowel movement   You may have cramping, bloating, and feeling of "gas". These feelings should go away as you pass gas. If you still feel uncomfortable, walking around will help to pass the gas.   You were given medication to help you relax during the test. You may feel "fuzzy" and drowsy. Go home and rest for at least 4-6 hours.    You Should Call You Doctor For any of the Following:     Bad stomach pain    Fever    Bright red bleeding or clots (This may happen up to 2 weeks after the test.)    Dizziness or weakness that gets worse or last up to 24 hours.    Pain or redness at the IV site    If you have a serious problem after hours, Call [phone] to reach the GI physician on call. If you are unable to reach your doctor, go to the [facility] Emergency Department.    Follow Up Care:  > If biopsies were taken a letter will be sent to you with the results within 7-10 days. If you do not receive a report after 2 weeks please call the office at [phone].  > Report will be sent to your primary doctor.    A referral will be made to remove a large rectal polyp with Dr. Vickie Epley in colorectal surgery.

## 2017-05-09 NOTE — Preop H&P (Signed)
OUTPATIENT PRE-PROCEDURE H&P    Chief Complaint / Indications for Procedure: evaluate large polypectomy site    Past Medical History:     Past Medical History:   Diagnosis Date    Anxiety     Aortic valve disease     Arthritis     Barrett's esophagus     Colon polyp     COPD (chronic obstructive pulmonary disease)     Depression     GERD (gastroesophageal reflux disease)     Gout     Hiatal hernia     Migraine     Thyroid disease      Past Surgical History:   Procedure Laterality Date    CHOLECYSTECTOMY      Cholecystectomy Conversion Data     CONVERTED PROCEDURE      Nasal Endoscopy Polypectomy Conversion Data     CONVERTED PROCEDURE      Oophorectomy Unilateral Left Side Conversion Data     OVARIAN CYST SURGERY      patient does not remember which side     Family History   Problem Relation Age of Onset    Pacemaker Mother     Heart surgery Mother 12     3 bypass    COPD Brother      Social History     Social History    Marital status: Married     Spouse name: N/A    Number of children: N/A    Years of education: N/A     Social History Main Topics    Smoking status: Current Every Day Smoker     Packs/day: 1.00     Years: 27.00     Types: Cigarettes     Start date: 09/10/1977    Smokeless tobacco: Never Used    Alcohol use No    Drug use: No    Sexual activity: Not Asked     Other Topics Concern    None     Social History Narrative       Allergies:    Allergies   Allergen Reactions    No Known Drug Allergy      Created by Conversion - 0;        Medications:  Prescriptions Prior to Admission   Medication Sig    ALPRAZolam (XANAX) 0.25 MG tablet Take 0.25 mg by mouth 3 times daily as needed for Anxiety    citalopram (CELEXA) 40 MG tablet Take 40 mg by mouth daily    esomeprazole (NEXIUM) 40 MG capsule Take 40 mg by mouth 2 times daily (with breakfast and dinner)    levothyroxine (SYNTHROID, LEVOTHROID) 75 MCG tablet Take 75 mcg by mouth daily (before breakfast)    rizatriptan (MAXALT)  10 MG tablet Take 10 mg by mouth as needed for Migraine   May repeat in 2 hours if needed.  Max 3 tabs/day.    allopurinol (ZYLOPRIM) 100 MG tablet Take 100 mg by mouth daily    zolpidem (AMBIEN) 10 MG tablet Take 10 mg by mouth nightly as needed for Sleep    aspirin 81 MG tablet Take 81 mg by mouth daily    Multiple Vitamins-Minerals (MULTIVITAMIN ADULT PO) Take by mouth daily    Cyanocobalamin (VITAMIN B 12 PO) Take by mouth daily    ergocalciferol (DRISDOL) 50000 UNIT capsule Take 50,000 Units by mouth once a week    melatonin 3 MG Take 3 mg by mouth nightly as needed for Sleep  Current Facility-Administered Medications   Medication Dose Route Frequency    sodium chloride 0.9 % IV  50 mL/hr Intravenous Continuous    lidocaine 1 % injection 0.1 mL  0.1 mL Subcutaneous PRN     No current outpatient prescriptions on file.     Vitals:    05/09/17 0930   BP: 127/80   Pulse: 75   Resp: 15   Temp: 35.9 C (96.6 F)   Weight: 98.3 kg (216 lb 12.8 oz)   Height: 165.1 cm ('5\' 5"' )           Physical Examination:Oropharynx: mucosa pink and moist  Lungs: percussion normal, good diaphragmatic excursion, lungs clear to auscultation bilaterally  Heart: regular rate and rhythm, no murmurs, gallops or rubs  Abdomen: abdomen soft, non-tender, nondistended, normal active bowel sounds, no masses or organomegaly  Neuro: No focal neurology  Extremities/Musculoskeletal: No rash,cyanosis,or edema noted       Last Lab Results:   Lab Results   Component Value Date/Time    WBC 7.6 04/07/2016 11:28 AM    HCT 42.4 04/07/2016 11:28 AM    PLT 241 04/07/2016 11:28 AM    AST 20 04/07/2016 11:28 AM    ALT 18 04/07/2016 11:28 AM    ALK 124 (H) 04/07/2016 11:28 AM    TB 0.2 04/07/2016 11:28 AM    NA 144 04/07/2016 11:28 AM    K 4.2 04/07/2016 11:28 AM    UN 12 04/07/2016 11:28 AM    CREAT 0.82 04/07/2016 11:28 AM         Radiology impressions (last 30 days):  No results found.    Currently Active Problems:  Patient Active Problem List    Diagnosis Code    Hypothyroidism E03.9        Impression:  evaluate large polypectomy site    Plan:  Colonoscopy  Moderate Sedation    UPDATES TO PATIENT'S CONDITION on the DAY OF SURGERY/PROCEDURE    I. Updates to Patient's Condition (to be completed by a provider privileged to complete a H&P, following reassessment of the patient by the provider):    Full H&P done today; no updates needed.    II. Procedure Readiness   I have reviewed the patient's H&P and updated condition. By completing and signing this form, I attest that this patient is ready for surgery/procedure.      III. Attestation   I have reviewed the updated information regarding the patient's condition and it is appropriate to proceed with the planned surgery/procedure.    Tonette Lederer, MD as of 9:53 AM 05/09/2017

## 2017-05-10 LAB — SURGICAL PATHOLOGY

## 2017-05-17 ENCOUNTER — Ambulatory Visit
Admission: RE | Admit: 2017-05-17 | Discharge: 2017-05-17 | Disposition: A | Discharge status: Home / Self Care | Payer: No Typology Code available for payment source | Source: Ambulatory Visit | Attending: Surgery | Admitting: Surgery

## 2017-05-17 ENCOUNTER — Ambulatory Visit: Payer: No Typology Code available for payment source | Attending: Gastroenterology | Admitting: Surgery

## 2017-05-17 ENCOUNTER — Encounter
Admission: RE | Disposition: A | Discharge status: Home / Self Care | Payer: No Typology Code available for payment source | Source: Ambulatory Visit | Attending: Surgery

## 2017-05-17 ENCOUNTER — Encounter: Payer: Self-pay | Admitting: Surgery

## 2017-05-17 ENCOUNTER — Other Ambulatory Visit: Payer: Self-pay | Admitting: Family Medicine

## 2017-05-17 VITALS — BP 138/90 | HR 83 | Temp 98.0°F | Ht 65.0 in | Wt 218.4 lb

## 2017-05-17 DIAGNOSIS — K621 Rectal polyp: Secondary | ICD-10-CM

## 2017-05-17 DIAGNOSIS — D126 Benign neoplasm of colon, unspecified: Secondary | ICD-10-CM | POA: Insufficient documentation

## 2017-05-17 HISTORY — PX: PR SIGMOIDOSCOPY FLX DX W/COLLJ SPEC BR/WA IF PFRMD: 45330

## 2017-05-17 LAB — CBC
Hematocrit: 46.6 % — ABNORMAL HIGH (ref 34.1–44.9)
Hemoglobin: 16 g/dL — ABNORMAL HIGH (ref 11.2–15.7)
MCH: 32.8 pg — ABNORMAL HIGH (ref 25.6–32.2)
MCHC: 34.3 g/dL (ref 32.2–35.5)
MCV: 95.5 fL — ABNORMAL HIGH (ref 79.4–94.8)
Nucl RBC # K/uL: 0 /100 WBC (ref 0.0–0.2)
Platelets: 254 10*3/uL (ref 182–369)
RBC Distribution Width-SD: 47.8 fL — ABNORMAL HIGH (ref 36.4–46.3)
RBC: 4.88 10*6/uL (ref 3.93–5.22)
RDW: 13.6 % (ref 11.7–14.4)
WBC: 6.9 10*3/uL (ref 4.0–10.0)

## 2017-05-17 LAB — LIPID PANEL
Chol/HDL Ratio: 3.2 (ref 1.0–3.5)
Cholesterol: 166 mg/dL (ref 0–199)
HDL: 52 mg/dL (ref 40–60)
LDL Calculated: 87 mg/dL (ref 0–129)
Triglycerides: 137 mg/dL (ref 0–149)

## 2017-05-17 LAB — TSH: TSH: 0.44 u[IU]/mL (ref 0.27–4.20)

## 2017-05-17 LAB — BASIC METABOLIC PANEL
Anion Gap: 13 (ref 7–16)
CO2: 27 mmol/L (ref 20–28)
Calcium: 9.6 mg/dL (ref 8.6–10.2)
Chloride: 102 mmol/L (ref 96–108)
Creatinine: 0.89 mg/dL (ref 0.51–0.95)
GFR,Black: 79 mL/min/{1.73_m2}
GFR,Caucasian: 65 mL/min/{1.73_m2}
Glucose: 115 mg/dL — ABNORMAL HIGH (ref 60–99)
Lab: 11 mg/dL (ref 6–20)
Potassium: 3.9 mmol/L (ref 3.4–4.7)
Sodium: 142 mmol/L (ref 133–145)

## 2017-05-17 SURGERY — SIGMOIDOSCOPY, FLEXIBLE

## 2017-05-17 MED ORDER — MIDAZOLAM HCL 1 MG/ML IJ SOLN *I* WRAPPED
INTRAMUSCULAR | Status: AC | PRN
Start: 2017-05-17 — End: 2017-05-17
  Administered 2017-05-17: 2 mg via INTRAVENOUS

## 2017-05-17 MED ORDER — DEXTROSE IN LACTATED RINGERS 5 % IV SOLN *I*
1000.0000 mL | INTRAVENOUS | Status: DC | PRN
Start: 2017-05-17 — End: 2017-05-25

## 2017-05-17 MED ORDER — LIDOCAINE HCL 1 % IJ SOLN *I*
0.1000 mL | INTRAMUSCULAR | Status: AC | PRN
Start: 2017-05-17 — End: 2017-05-18

## 2017-05-17 MED ORDER — ONDANSETRON 4 MG PO TBDP *I*
4.0000 mg | ORAL_TABLET | Freq: Once | ORAL | Status: AC | PRN
Start: 2017-05-17 — End: 2017-05-17

## 2017-05-17 MED ORDER — FLEET ENEMA 7-19 GM/118ML RE ENEM *I*
1.0000 | ENEMA | RECTAL | Status: AC | PRN
Start: 2017-05-17 — End: 2017-05-18

## 2017-05-17 MED ORDER — FENTANYL CITRATE 50 MCG/ML IJ SOLN *WRAPPED*
INTRAMUSCULAR | Status: AC | PRN
Start: 2017-05-17 — End: 2017-05-17
  Administered 2017-05-17: 100 ug via INTRAVENOUS

## 2017-05-17 MED ORDER — FENTANYL CITRATE 50 MCG/ML IJ SOLN *WRAPPED*
INTRAMUSCULAR | Status: AC
Start: 2017-05-17 — End: 2017-05-17
  Filled 2017-05-17: qty 4

## 2017-05-17 MED ORDER — SODIUM CHLORIDE 0.9 % IV SOLN WRAPPED *I*
50.0000 mL/h | Status: DC
Start: 2017-05-17 — End: 2017-05-25

## 2017-05-17 MED ORDER — SODIUM CHLORIDE 0.9 % IV SOLN WRAPPED *I*
125.0000 mL/h | Status: AC
Start: 2017-05-17 — End: 2017-05-18
  Administered 2017-05-17: 125 mL/h via INTRAVENOUS

## 2017-05-17 MED ORDER — PROMETHAZINE HCL 25 MG/ML IJ SOLN *I*
6.2500 mg | Freq: Once | INTRAMUSCULAR | Status: AC | PRN
Start: 2017-05-17 — End: 2017-05-17

## 2017-05-17 MED ORDER — MIDAZOLAM HCL 5 MG/5ML IJ SOLN *I*
INTRAMUSCULAR | Status: AC
Start: 2017-05-17 — End: 2017-05-17
  Filled 2017-05-17: qty 10

## 2017-05-17 MED ORDER — ONDANSETRON HCL 2 MG/ML IV SOLN *I*
4.0000 mg | Freq: Once | INTRAMUSCULAR | Status: AC
Start: 2017-05-17 — End: 2017-05-18

## 2017-05-17 MED ORDER — HEPARIN LOCK FLUSH 10 UNIT/ML IJ SOLN WRAPPED *I*
5.0000 mL | INTRAVENOUS | Status: DC | PRN
Start: 2017-05-17 — End: 2017-05-25

## 2017-05-17 SURGICAL SUPPLY — 50 items
APPLICATOR SCOPETTE COTTON TIP (Supply) IMPLANT
BAG ZIPLOCK BIOHAZ W/POUCH 6X9 USE 246168 (Supply) ×2 IMPLANT
BRIEF MESH XL GREEN (Supply) ×1
CATH IV ANGIO 14G X 1.25 IN (Supply) IMPLANT
CINCHPAD 25 (Supply) ×1
CONTAINER FORMALIN PREFILL 60ML (Supply) ×2 IMPLANT
CORD FIBER OPTIC WITH LIGHT TIP (Other) IMPLANT
DRAPE PED LAPAROTOMY (Drape) ×2 IMPLANT
DRESSING ABDOMINAL PAD 5 X 9 LF (Dressing) ×2 IMPLANT
DRESSING TELFA 8X3 RELEASE (Dressing) IMPLANT
FILTER NEPTUNE 4PORT MANIFOLD (Supply) ×2 IMPLANT
GLOVE BIOGEL PI MICRO IND UNDER SZ 6.5 LF (Glove) ×2 IMPLANT
GLOVE SURG BIOGEL PI ULTRATOUCH SZ 6.0 (Glove) ×2 IMPLANT
GOWN SIRIUS RAGLAN NONREINFORCED XL (Gown) ×2 IMPLANT
KIT DISP - SLIDE ONE (Suture) IMPLANT
KIT SCOPEVALET BEDSIDE (Supply) ×2 IMPLANT
LOOP VASC MAXI 30IN BLUE LF (Supply) IMPLANT
LUBRICANT SURGILUBE 4.5OZ STER (Supply) ×2 IMPLANT
NEEDLE HYPO BVL LF 25G X 1.5IN (Needle) ×4 IMPLANT
PACK CUSTOM MINOR GENERAL (Pack) ×2 IMPLANT
PAD TRNSPRT GRN ABSRB INF CTRL LEAK PRF CINCHPAD (Supply) ×1 IMPLANT
PILLOW PRONE POSITION 7 IN (Supply) IMPLANT
PNT INCONT L/XL KNIT NOVAPLUS (Supply) ×1 IMPLANT
PUNCH BIOPSY 4MM (Other)
PUNCH DISPOSABLE BIOPSY 4MM SINGLE USE (Other) IMPLANT
RING RETRACT LONESTAR 14.1 X 14.1CM DISP (Supply) IMPLANT
SOL H2O IRRIG 500ML STERILE BTL (Solution) ×1 IMPLANT
SOL H2O IRRIG STER 500ML BTL (Solution) ×2
SOL PVP TOPICAL PREP 4OZ (Other) ×2 IMPLANT
SOL SOD CHL IRRIG 500ML BTL (Solution) ×2 IMPLANT
SPONGE HEMSTAT W8XL3CM HEMORRHOIDECTOMY PORCINE GEL H2O INSOLUBLE MAL SURGFOAM (Sponge) IMPLANT
STAY RETRACTOR DISPOSABLE 5MM (Supply) IMPLANT
STRIP GAUZE 1IN X 5YD 100 PCT COTTON IODOFORM FINE MESH HIGHLY ABSORB (Dressing) IMPLANT
STRIP PACK GAUZE IODOFORM 1IN X 5YD (Dressing) IMPLANT
SUTR CHROMIC GUT 3-0 SH 27IN (Suture) IMPLANT
SUTR ETHILON MONO 2-0 30IN BLACK (Suture) IMPLANT
SUTR PROLENE MONO 0 CT-2 30 IN (Suture) IMPLANT
SUTR PROLENE MONO 2-0SH 48IN BLUE (Suture) IMPLANT
SUTR SILK 0 SH 30 IN BLACK (Suture) IMPLANT
SUTR SILK 2-0 SH 30 IN BLACK (Suture) IMPLANT
SUTR VICRYL ANTIB 0 UR-6 27 VIOLET (Suture) IMPLANT
SYRINGE LUERLOCK 1CC LF (Supply) IMPLANT
SYRINGE LUERLOCK 20ML INDIVIDUAL WRAP (Supply)
SYRINGE LUERLOCK 20ML INDIVIDUAL WRAPPED (Supply) IMPLANT
SYRINGE LUERLOCK 5ML INDIVIDUAL WRAP (Supply) IMPLANT
SYRINGE LUERLOCK CNTL 10CC (Supply) ×2 IMPLANT
TUBE WATER AUX SINGLE DAY USE (Supply) IMPLANT
TUBING 7/8IN SMOKE W/WAND (Tubing) IMPLANT
TUBING MEDI-VAC NONCONDUC 12FT X 3/16IN (Tubing) ×2 IMPLANT
TUBING SMARTCAP ENDO CO2 F/OLYMPUS (Tubing) ×2 IMPLANT

## 2017-05-17 NOTE — Interval H&P Note (Signed)
UPDATES TO PATIENT'S CONDITION on the DAY OF SURGERY/PROCEDURE    I. Updates to Patient's Condition (to be completed by a provider privileged to complete a H&P, following reassessment of the patient by the provider):    Day of Surgery/Procedure Update:  History  History reviewed and no change    Physical  Physical exam updated and no change            II. Procedure Readiness   I have reviewed the patient's H&P and updated condition. By completing and signing this form, I attest that this patient is ready for surgery/procedure.    III. Attestation   I have reviewed the updated information regarding the patient's condition and it is appropriate to proceed with the planned surgery/procedure.    Doren Custard, MD as of 1:24 PM 05/17/2017

## 2017-05-17 NOTE — H&P (Signed)
Allergies   Allergen Reactions    No Known Drug Allergy      Created by Conversion - 0;        Current Outpatient Prescriptions   Medication    ALPRAZolam (XANAX) 0.25 MG tablet    ergocalciferol (DRISDOL) 50000 UNIT capsule    esomeprazole (NEXIUM) 40 MG capsule    levothyroxine (SYNTHROID, LEVOTHROID) 75 MCG tablet    rizatriptan (MAXALT) 10 MG tablet    allopurinol (ZYLOPRIM) 100 MG tablet    zolpidem (AMBIEN) 10 MG tablet    aspirin 81 MG tablet    Multiple Vitamins-Minerals (MULTIVITAMIN ADULT PO)    Cyanocobalamin (VITAMIN B 12 PO)    varenicline (CHANTIX STARTING MONTH PAK) 0.5 MG X 11 & 1 MG X 42 tablet pak    citalopram (CELEXA) 40 MG tablet     No current facility-administered medications for this visit.        Patient Active Problem List   Diagnosis Code    Hypothyroidism E03.9       Past Medical History:   Diagnosis Date    Anxiety     Aortic valve disease     Arthritis     Barrett's esophagus     Colon polyp     COPD (chronic obstructive pulmonary disease)     Depression     GERD (gastroesophageal reflux disease)     Gout     Hiatal hernia     Migraine     Thyroid disease        Past Surgical History:   Procedure Laterality Date    CHOLECYSTECTOMY      Cholecystectomy Conversion Data     CONVERTED PROCEDURE      Nasal Endoscopy Polypectomy Conversion Data     CONVERTED PROCEDURE      Oophorectomy Unilateral Left Side Conversion Data     OVARIAN CYST SURGERY      patient does not remember which side       Family History   Problem Relation Age of Onset    Pacemaker Mother     Heart surgery Mother 82     3 bypass    COPD Brother        Social History     Social History    Marital status: Married     Spouse name: N/A    Number of children: N/A    Years of education: N/A     Occupational History    Not on file.     Social History Main Topics    Smoking status: Current Every Day Smoker     Packs/day: 1.00     Years: 27.00     Types: Cigarettes     Start date: 09/10/1977     Smokeless tobacco: Never Used    Alcohol use No    Drug use: No    Sexual activity: Not on file     Social History Narrative            Chief Complaint   Patient presents with    New Patient Visit       History of Present Illness  Brandi Warner is a 61 year old female who presents today for surgical consultation regarding unresectable rectal polyp. She has had this polyp for over a year and was getting removed by piecemeal until her  Endoscopist passed away. She then moved to [place] this past year and Dr. Jone Baseman tried in June and just recently to remove the polyp  but could not. Pathology revealed tubulovillous adenoma. She is not having difficulty with BM's and denies pain or bleeding. Denies family hx of colorectal cancer.     Review of Systems:  Pertinent items are noted in HPI.    Vitals:    05/17/17 0950   BP: 138/90   Pulse: 83   Temp: 36.7 C (98 F)   Weight: 99.1 kg (218 lb 6.4 oz)   Height: 1.651 m (5\' 5" )     Physical Exam:  General: well developed, well nourished  Orientation: alert and oriented times three  Eyes: anicteric sclera  Neuro: non-focal  Lungs: clear to auscultation bilaterally  Cardiac: regular rate and rhythm  A complete GI exam was performed.  Details include: BS normal.  Abdomen soft, non-tender.  No masses or organomegaly  Anorectal exam deferred by patient.       Assessment   1. Rectal polyp            Plan:  Floris presents for evaluation of a recurrent rectal polyp.  Pathology reveals tubulovillous adenoma.  This can likely be removed by TEMS.  Since she is nervous we will plan for evaluation by proctoscopy with some sedation later today for surgical planning. The risks (bleeding, infection, perforation, recurrence). , benefits and alternatives of TEMS resection were discussed with the patient who agrees to proceed.

## 2017-05-17 NOTE — H&P (View-Only) (Signed)
OUTPATIENT PRE-PROCEDURE H&P    Chief Complaint / Indications for Procedure: evaluate large polypectomy site    Past Medical History:     Past Medical History:   Diagnosis Date    Anxiety     Aortic valve disease     Arthritis     Barrett's esophagus     Colon polyp     COPD (chronic obstructive pulmonary disease)     Depression     GERD (gastroesophageal reflux disease)     Gout     Hiatal hernia     Migraine     Thyroid disease      Past Surgical History:   Procedure Laterality Date    CHOLECYSTECTOMY      Cholecystectomy Conversion Data     CONVERTED PROCEDURE      Nasal Endoscopy Polypectomy Conversion Data     CONVERTED PROCEDURE      Oophorectomy Unilateral Left Side Conversion Data     OVARIAN CYST SURGERY      patient does not remember which side     Family History   Problem Relation Age of Onset    Pacemaker Mother     Heart surgery Mother 64     3 bypass    COPD Brother      Social History     Social History    Marital status: Married     Spouse name: N/A    Number of children: N/A    Years of education: N/A     Social History Main Topics    Smoking status: Current Every Day Smoker     Packs/day: 1.00     Years: 27.00     Types: Cigarettes     Start date: 09/10/1977    Smokeless tobacco: Never Used    Alcohol use No    Drug use: No    Sexual activity: Not Asked     Other Topics Concern    None     Social History Narrative       Allergies:    Allergies   Allergen Reactions    No Known Drug Allergy      Created by Conversion - 0;        Medications:  Prescriptions Prior to Admission   Medication Sig    ALPRAZolam (XANAX) 0.25 MG tablet Take 0.25 mg by mouth 3 times daily as needed for Anxiety    citalopram (CELEXA) 40 MG tablet Take 40 mg by mouth daily    esomeprazole (NEXIUM) 40 MG capsule Take 40 mg by mouth 2 times daily (with breakfast and dinner)    levothyroxine (SYNTHROID, LEVOTHROID) 75 MCG tablet Take 75 mcg by mouth daily (before breakfast)    rizatriptan (MAXALT)  10 MG tablet Take 10 mg by mouth as needed for Migraine   May repeat in 2 hours if needed.  Max 3 tabs/day.    allopurinol (ZYLOPRIM) 100 MG tablet Take 100 mg by mouth daily    zolpidem (AMBIEN) 10 MG tablet Take 10 mg by mouth nightly as needed for Sleep    aspirin 81 MG tablet Take 81 mg by mouth daily    Multiple Vitamins-Minerals (MULTIVITAMIN ADULT PO) Take by mouth daily    Cyanocobalamin (VITAMIN B 12 PO) Take by mouth daily    ergocalciferol (DRISDOL) 50000 UNIT capsule Take 50,000 Units by mouth once a week    melatonin 3 MG Take 3 mg by mouth nightly as needed for Sleep  Current Facility-Administered Medications   Medication Dose Route Frequency    sodium chloride 0.9 % IV  50 mL/hr Intravenous Continuous    lidocaine 1 % injection 0.1 mL  0.1 mL Subcutaneous PRN     No current outpatient prescriptions on file.     Vitals:    05/09/17 0930   BP: 127/80   Pulse: 75   Resp: 15   Temp: 35.9 C (96.6 F)   Weight: 98.3 kg (216 lb 12.8 oz)   Height: 165.1 cm ('5\' 5"' )           Physical Examination:Oropharynx: mucosa pink and moist  Lungs: percussion normal, good diaphragmatic excursion, lungs clear to auscultation bilaterally  Heart: regular rate and rhythm, no murmurs, gallops or rubs  Abdomen: abdomen soft, non-tender, nondistended, normal active bowel sounds, no masses or organomegaly  Neuro: No focal neurology  Extremities/Musculoskeletal: No rash,cyanosis,or edema noted       Last Lab Results:   Lab Results   Component Value Date/Time    WBC 7.6 04/07/2016 11:28 AM    HCT 42.4 04/07/2016 11:28 AM    PLT 241 04/07/2016 11:28 AM    AST 20 04/07/2016 11:28 AM    ALT 18 04/07/2016 11:28 AM    ALK 124 (H) 04/07/2016 11:28 AM    TB 0.2 04/07/2016 11:28 AM    NA 144 04/07/2016 11:28 AM    K 4.2 04/07/2016 11:28 AM    UN 12 04/07/2016 11:28 AM    CREAT 0.82 04/07/2016 11:28 AM         Radiology impressions (last 30 days):  No results found.    Currently Active Problems:  Patient Active Problem List    Diagnosis Code    Hypothyroidism E03.9        Impression:  evaluate large polypectomy site    Plan:  Colonoscopy  Moderate Sedation    UPDATES TO PATIENT'S CONDITION on the DAY OF SURGERY/PROCEDURE    I. Updates to Patient's Condition (to be completed by a provider privileged to complete a H&P, following reassessment of the patient by the provider):    Full H&P done today; no updates needed.    II. Procedure Readiness   I have reviewed the patient's H&P and updated condition. By completing and signing this form, I attest that this patient is ready for surgery/procedure.      III. Attestation   I have reviewed the updated information regarding the patient's condition and it is appropriate to proceed with the planned surgery/procedure.    Tonette Lederer, MD as of 9:53 AM 05/09/2017

## 2017-05-17 NOTE — Discharge Instructions (Signed)
How to Take Fiber    1. Why fiber?  The purpose of a fiber supplement is to add bulk to your stool and to consolidate the stool so it is easier to pass.  Fiber is also good for your heart, blood vessels and colon and it lowers cholesterol.  Everybody needs 25-30 grams of fiber each day.    2. Why do I need a fiber supplement if I am not constipated?  The amount of daily fiber supplement that we recommend here is not considered a laxative.  If you were to take 3-4 times that amount, it could have a laxative effect.  The goal is to help you have bowel movements that are easy to pass.    3. Every morning, take one of the following:  a. Coarse milled or Original Texture Metamucil powder, 2-3 tablespoons of orange flavored or 2-3 teaspoons of unflavored in juice.   b. Konsyl powder, 2-3 level teaspoons    4. Mix the fiber supplement in 8 ounces of water, juice or sugar-free flavored beverage of your choice.  It is best to use a shaker cup or a cup with a lid.  Shake vigorously and then drink quickly.  Then, pour a small amount of water into the cup, swirl and drink to consume the rest of the fiber in your cup. You can obtain a free shaker cup at www.konsyl.com.    5. Diabetics:  Konsyl powder is sugar-free and is preferred for diabetics.  You can mix in water or the sugar-free flavored beverage of your choice.    6. Every day, drink 6 - 8 (8 ounce) glasses of caffeine-free beverages, preferably water.  You can drink caffeinated beverages, but don’t count them when you add up your daily fluid intake total.    7. Can I take my fiber in the evening?  Do not take your fiber supplement in the evening as it has the potential to cause constipation.  However, if you have loose or watery stools, you may try taking your fiber supplement at night to help thicken your stools.    8. Fiber supplements for travelers:  a. Metamucil & Konsyl are available in individual dose packets.  You can ask the pharmacist to order a box for  you.  b. Metamucil fiber wafers (2 wafers = 1 dose & 120 calories)  c. Fiber One bars (one bar= 9 grams fiber)  & cereal (one bowl = 13 grams fiber)    9. Why not fiber pills?  We have found that fiber pills or capsules, smooth texture or easy-mix powders, and fiber supplements that dissolve in liquids are not as effective as the supplements listed above.    10. Does fiber in Metamucil or Konsyl cause intestinal gas?  Metamucil and Konsyl sometimes cause intestinal gas for a week or so until your body becomes accustomed to the fiber.  If you have significant gas, try a carton of yogurt with active culture daily.  The active culture in [employer] yogurt is especially good for preventing intestinal gas.    When can I stop taking the fiber supplement?  Never.  Fiber is a forever deal if you wish to prevent hemorrhoids, constipation, anal fissures and other colon/rectal problems    High Fiber Food Chart    Category A (more than 7 grams per serving)  FOOD AMOUNT TOTAL FIBER (grams)   Avocado 1 medium 11.84   Black beans, cooked 1 cup 14.92   Bran cereal 1 cup   19.94   Broccoli, cooked 1 cup 10.97   Green peas, cooked 1 cup 8.84   Kale, cooked 1 cup 7.20   Kidney beans, cooked 1 cup 13.33   Lentils, cooked 1 cup 15.64   Lima beans, cooked 1 cup 13.16   Navy beans, cooked 1 cup 11.65   Oats, dry 1 cup 12.00   Pinto beans, cooked 1 cup 14.71   Split peas, cooked 1 cup 16.27   Raspberries 1 cup 8.34   Rice, brown, uncooked 1 cup 7.98   Soybeans, cooked 1 cup 7.62     Category B (more than 3 grams per serving)   FOOD AMOUNT TOTAL FIBER (grams)   Almonds 1 oz. 4.22   Apples, w/skin 1 medium 5.00   Banana 1 medium 3.92   Blueberries 1 cup 4.18   Cabbage, cooked 1 cup 4.20   Cauliflower, cooked 1 cup 3.43   Corn, sweet 1 cup 4.66   Figs, dried 2 medium 3.74   Flax seeds 3 tsp 6.97   Garbanzo beans, cooked 1 cup 5.80   Grapefruit 1/2 medium 6.12   Green beans, cooked 1 cup 3.95   Olives 1 cup 4.30   Oranges, navel 1 medium  3.40   Papaya 1 each 5.47   Pasta, whole wheat 1 cup 6.34   Peach, dried 3 pcs. 3.18   Pear 1 medium 5.08   Pistachio nuts 1 oz 3.10   Potato, baked w/ skin 1 medium 4.80   Prunes 1/4 cup 3.02   Pumpkin seeds 1/4 cup 4.12   Sesame seeds 1/4 cup 4.24   Spinach, cooked 1 cup 4.32   Strawberries 1 cup 3.98   Sweet Potato, cooked 1 cup 5.94   Swiss chard, cooked 1 cup 3.68   Turnip greens, cooked 1 cup 5.04   Winter squash 1 cup 5.74   Yam, cooked cubes 1 cup 5.30     Category C (less than 3 grams per serving)   FOOD AMOUNT TOTAL FIBER (grams)   Apricot 3 medium 0.98   Apricots, dried 5 pieces 2.89   Asparagus, cooked 1 cup 2.88   Beets, cooked 1 cup 2.85   Bread, whole wheat 1 slice 2.00   Brussels sprouts, cooked 1 cup 2.84   Cantaloupe, cubes 1 cup 1.28   Carrots, raw 1 medium 2.00   Cashews 1 oz. 1.00   Celery 1 stalk 1.02   Collard greens, cooked 1 cup 2.58   Cranberries 1/2 cup 1.99   Cucumber, sliced w/ peel 1 cup 0.83   Eggplant, cooked cubes 1 cup 2.48   Kiwifruit 1 each 2.58   Mushrooms, raw 1 cup 1.36   Mustard greens, cooked 1 cup 2.80   Onions, raw 1 cup 2.88   Peanuts 1 oz 2.30   Peach 1 medium 2.00   Peppers, sweet 1 cup 2.62   Pineapple 1 cup 1.86   Plum 1 medium 1.00   Raisins 1.5 oz box 1.60   Romaine lettuce 1 cup 0.95   Summer squash, cooked 1 cup 2.52   Sunflower seeds 1/4 cup 3.00   Tomato 1 medium 1.00   Walnuts 1 oz. 2.98   Zucchini, cooked 1 cup 2.63

## 2017-05-17 NOTE — Patient Instructions (Signed)
Plan for flex sig today

## 2017-05-18 ENCOUNTER — Encounter: Payer: Self-pay | Admitting: Surgery

## 2017-05-25 ENCOUNTER — Encounter: Payer: Self-pay | Admitting: Nurse Practitioner

## 2017-05-25 NOTE — Anesthesia Preprocedure Evaluation (Addendum)
Anesthesia Pre-operative History and Physical for Brandi Warner  History and Physical Performed at [facility] ([facility])    Stress Test/Echocardiography:  09/23/16 Echo Interpretation Summary   Normal LV size and function  Mild aortic valve regurgitation  Normal estimated PA pressures and right heart function        Electrophysiology/AICD/Pacer:  EKG: Sinus rhythm, rate 64. Incomplete RBBB and LAFB.     CPM Summary:  Brandi Warner presents preoperatively for anesthesia evaluation prior to TRANSANAL ENDOSCOPIC MICROSURGERY (TEMS) on 06/09/17 with Dr. Vickie Epley.     She has a Past Medical History:  Aortic valve disease- regurgitation -mild- eval by cardiologist for SOB Dr. Diana Eves 09/2016   COPD-no inhalers, SOB with activity   Obesity BMI 36.6  Hypothyroidism -levothyroxine   Anxiety/Depression/PTSD- Celexa and Xanax   Barrett's esophagus/GERD/Hiatal hernia - Controlled on Nexium   Colon polyp  Gout  Migraine- weekly-Maxalt  Tobacco abuse- ongoing x 39 yrs, +productive cough - worse in am.  Arthritis- left leg sciatica     The patient has mild dyspnea with exertion, denies chest pain, is moderately active, can lie flat and does not have stairs to climb. Liberty is able to clean house but needs to stop after vacuuming 1 room due to SOB. No regular walking or exercise routine.      By Letha Cape, NP at 3:07 PM on 05/25/2017    Anesthesia Evaluation Information Source: patient, family, records     ANESTHESIA HISTORY    + history of anesthetic complications          PONV  Pertinent(-):  No Family hx of anesthetic complications    GENERAL    + Obesity  Comment: Denies fever, chills, night sweats or unexplained weight loss or gain. No new skin issues   Pertinent (-):  No substance abuse    HEENT    + Sinus Issues            PND  Pertinent (-):  No hearing loss, TMJD or neck pain PULMONARY    + Smoker          tobacco currently, tobacco advised to quit    + COPD          moderate    + Shortness of Breath          with minimal  exertion    + Cough/congestion          clear sputum    + Snoring          no observed apnea  Pertinent(-):  No currently active environmental allergies    CARDIOVASCULAR  Poor<4 METS Exercise Tolerance    + Cardiac Testing          echo    + Valvular heart disease (Mild aortic regurg )    + Anticoagulants          ASA  Pertinent(-):  No past MI, angina, CABG, coronary intervention, dysrhythmias, vascular Issues, hx of DVT or hypotension    GI/HEPATIC/RENAL  Last PO Intake: >8hr before procedure and >2hr before procedure (clears)    + GERD          well controlled    + Esophageal Issues         hiatal hernia  Pertinent(-):  No alcohol use, renal issues or urinary issuesBowel Issues: polyp. NEURO/PSYCH    + Headaches          migraines and within last week    + Chronic  pain (sciatica)    + Psychiatric Issues          anxiety, depression, PTSD    + Gait/Mobility issues (due to left sciatic pain)  Pertinent(-):  No dizziness/motion sickness, syncope, cerebrovascular event or peripheral nerve issue    ENDO/OTHER    + Thyroid Disease        HYPOthyroid   Pertinent(-):  No diabetes mellitus, chemo Hx, menstruating (post-menopause)     Comment: Gout    HEMATOLOGIC    + Anticoagulants/Antiplatelets          ASA    + Arthritis          lumbar  Pertinent(-):  No blood transfusion, blood dyscrasia or autoimmune disease       Physical Exam    Airway            Mouth opening: normal            Mallampati: II            TM distance (fb): >3 FB            Neck ROM: full  Dental        Comment: Denies loose or broken teeth   Cardiovascular  Normal Exam           Rhythm: regular           Rate: normal  No peripheral edema or murmur      Neurologic    Normal Exam    General Survey    Normal Exam   Pulmonary     + Decreased breath sounds (throughout )    Mental Status   Normal Exam    oriented to person, place and time     Patient Education from [facility] Provider:  Patient Education:  The following items were discussed with Brandi Warner to  her satisfaction and comprehension:  The facility's NPO guidelines were discussed  To call the surgeon if she becomes ill prior to surgery  All questions were answered  Medications DOS with sip of water: per AVS  Hold medications AM day of surgery: per AVS  Nurse reviewed additional items as indicated in the education record.  Brandi Warner verbalized knowledge and teaching objectives met.  No barriers to learning identified.  IV insertion was reviewed with her.  The importance of coughing and deep breathing was emphasized.  BEAULAH ROMANEK was instructed to hold ASA/NSAIDS 7 days before surgery.  Smoking cessation discussed with patient; information given to pt.   Instructed pt to discuss Bowel prep with surgeons office prior to surgery.   ________________________________________________________________________  PLAN  ASA Score  3  Anesthetic Plan general     Induction (routine IV) General Anesthesia/Sedation Maintenance Plan (inhaled agents and neuromuscular blockade);  Airway Manipulation (direct laryngoscopy); Airway (cuffed ETT); Line ( use current access); Monitoring (standard ASA); Positioning (lithotomy); PONV Plan (dexamethasone and ondansetron); Pain (per surgical team); PostOp (PACU)    Informed Consent     Risks:          Risks discussed were commensurate with the plan listed above with the following specific points: N/V, aspiration, sore throat, hypotension, dizziness and fatigue , damage to:(eyes, teeth), allergic Rx, unexpected serious injury    Anesthetic Consent:         Anesthetic plan (and risks as noted above) were discussed with patient and spouse    Plan also discussed with team members including:       CRNA  Attending Attestation:  As the primary attending anesthesiologist, I attest that the patient or proxy understands and accepts the risks and benefits of the anesthesia plan. I also attest that I have personally performed a pre-anesthetic examination and evaluation, and prescribed the  anesthetic plan for this particular location within 48 hours prior to the anesthetic as documented. Pryor Ochoa, MD 3:41 PM

## 2017-05-31 ENCOUNTER — Other Ambulatory Visit: Payer: Self-pay | Admitting: Cardiology

## 2017-05-31 ENCOUNTER — Ambulatory Visit
Admission: RE | Admit: 2017-05-31 | Discharge: 2017-05-31 | Disposition: A | Discharge status: Home / Self Care | Payer: No Typology Code available for payment source | Source: Ambulatory Visit | Attending: Surgery | Admitting: Surgery

## 2017-05-31 ENCOUNTER — Telehealth: Payer: Self-pay | Admitting: Surgery

## 2017-05-31 ENCOUNTER — Encounter: Payer: Self-pay | Admitting: Nurse Practitioner

## 2017-05-31 DIAGNOSIS — Z049 Encounter for examination and observation for unspecified reason: Secondary | ICD-10-CM | POA: Insufficient documentation

## 2017-05-31 DIAGNOSIS — K621 Rectal polyp: Secondary | ICD-10-CM | POA: Insufficient documentation

## 2017-05-31 DIAGNOSIS — R9431 Abnormal electrocardiogram [ECG] [EKG]: Secondary | ICD-10-CM

## 2017-05-31 DIAGNOSIS — Z01818 Encounter for other preprocedural examination: Secondary | ICD-10-CM | POA: Insufficient documentation

## 2017-05-31 HISTORY — DX: Tobacco use: Z72.0

## 2017-05-31 LAB — CBC
Hematocrit: 45 % (ref 34–45)
Hemoglobin: 15.3 g/dL (ref 11.2–15.7)
MCH: 33 pg/cell — ABNORMAL HIGH (ref 26–32)
MCHC: 34 g/dL (ref 32–36)
MCV: 97 fL — ABNORMAL HIGH (ref 79–95)
Platelets: 255 10*3/uL (ref 160–370)
RBC: 4.7 MIL/uL (ref 3.9–5.2)
RDW: 14 % (ref 11.7–14.4)
WBC: 8.1 10*3/uL (ref 4.0–10.0)

## 2017-05-31 LAB — BASIC METABOLIC PANEL
Anion Gap: 12 (ref 7–16)
CO2: 24 mmol/L (ref 20–28)
Calcium: 9.2 mg/dL (ref 8.6–10.2)
Chloride: 107 mmol/L (ref 96–108)
Creatinine: 0.78 mg/dL (ref 0.51–0.95)
GFR,Black: 96 *
GFR,Caucasian: 83 *
Glucose: 91 mg/dL (ref 60–99)
Lab: 9 mg/dL (ref 6–20)
Potassium: 4.4 mmol/L (ref 3.3–5.1)
Sodium: 143 mmol/L (ref 133–145)

## 2017-05-31 LAB — TYPE AND SCREEN
ABO RH Blood Type: O POS
Antibody Screen: NEGATIVE

## 2017-05-31 LAB — PROTIME-INR
INR: 1 (ref 0.9–1.1)
Protime: 11.5 s (ref 10.0–12.9)

## 2017-05-31 NOTE — Discharge Instructions (Signed)
[facility] Preoperative Instructions            Patient Name: Brandi Warner  Surgery Date:  Thursday, October 18        When to Arrive for Surgery         On the day prior to your surgery, Wednesday, October 17, you will find out your arrival time. [facility] - Please call [phone] between 2:30 and 7:00 p.m. .        Note: Patients scheduled for a procedure on Monday will be assigned an arrival time on the Friday before. Please note surgery start time is approximate. You may want to bring something to help pass the time.        PLEASE ARRIVE ON TIME.        Directions to [facility]: On the day of your procedure, park in the parking garage and take the elevator/stairs to Level One (1), then follow the walkway to the [facility].  Walk past the Information Desk in the lobby, towards the Lab & Outpatient Services.  Follow the GREEN (R) ceiling tags to the GREEN elevators. (Valet parking is available outside the front entrance of the hospital between 6:00 AM and 5:00 PM and assistance is available at the information desk, if needed).   Continue to the [facility]): Take the GREEN (G) elevators to the Basement (Level B - Two floors down) to the [facility] and check in with the receptionist at the desk.        Eating Guidelines        Follow the instructions below unless otherwise instructed by your physician.        No solid food AFTER MIDNIGHT on the day of your surgery. No candy, gum, mints or chewing tobacco.        You can have clear liquids up to 4 hours before your surgery. This includes water, apple juice,  clear carbonated beverages,  black coffee,  clear tea. No milk, cream, or non-dairy creamers.         Failure to follow these instructions, could lead to a delay or cancellation of your procedure.        Medication Guidelines        On the morning of surgery, take only the medications indicated on the  Preoperative Medication List below.        Medications should only be taken with no more than one ounce of water.        Hold any herbal supplements 5-7 days prior to surgery.  You may take Tylenol (Acetaminophen) if needed.  Aspirin: Do not take any aspirin products for 7 days before the procedure date.   Anti-Inflammatory products: Do not take any non-steroidal anti-inflammatory agents such as Ibuprofen (Advil, Motrin) or Naproxen (Aleve) 7 days before the procedure.  Do not take powder supplements or Metamucil on the day of the procedure.    Discuss with Dr. Vickie Epley prior to surgery what bowel prep you will need     Additional Information      Shower the night before and/or morning of surgery with an Antibacterial Soap (Like Dial soap).  Cleanse your body well with the soap the night before surgery and/or the morning of surgery.        What to bring or wear:        Bring Photo ID and insurance information.  Eye glasses and/or hearing aids:  These may be removed prior to surgery so be prepared to leave them with a trusted family member.        Do NOT wear contact lenses.        Wear comfortable, loose fit clothing.          You must arrange a ride home before coming to surgery.        What NOT to bring or wear:        Before coming to the hospital, remove all makeup (including mascara), jewelry (including wedding band and watch), hair accessories and nail polish from toes and fingers.  Do not bring any valuables (money, wallet, purse, jewelry, or contact lenses.)        Information for After Surgery:        Your family will be directed to a waiting area when you are taken to surgery.        We ask that only one or two family members accompany you on the day of your procedure.        No children under the age of 30 are allowed as visitors in the [place].        Additional visitor restrictions are possible during influenza season.        Your family will be notified when your surgery is  completed and you have arrived on the patient care unit.        Expected Length of Stay:        SDA23 Hour Admission: You are staying overnight after surgery.  Please leave any luggage in your car until after your procedure.  Your family can bring this into the hospital once you are in your room.        Health standards require that a responsible adult must accompany any patient who has received anesthetics or sedation and is going home the same day. You must arrange a ride home before coming to surgery.        Questions:    Please call the [facility] at [phone] between 8:00 a.m. and 4:00 p.m. Monday through Friday. You were seen today by Cathren Harsh, NP .                Surgical Site Infections FAQs        What is a Surgical Site infection (SSI)?  A surgical site infection is an infection that occurs after surgery In the part of the body where the surgery took place. Most patients who have surgery do not develop an infection. However, Infections develop in about 1 to 3 out of every 100 patients who have surgery.         Some of the common symptoms of a surgical site infection are:    Redness and pain around the area where you had surgery    Drainage of cloudy fluid from your surgical wound    Fever         Can SSIs be treated?   Yes. Most surgical site infections can be treated with antibiotics. The antibiotic given to you depends on the bacteria (germs) causing the Infection. Sometimes patients with SSIs also need another surgery to treat the infection.         What are some of the things that hospitals are doing to prevent SSls?   To prevent SSIs, doctors, nurses, and other healthcare providers:    Clean their hands and arms up  to their elbows with an antiseptic agent just before the surgery.    Clean their hands with soap and water or an alcohol-based hand rub before and after caring for each patient.    May remove some of your hair Immediately before your surgery using  electric clippers If the hair Is in the same area where the procedure will occur. They should not shave you with a razor.    Wear special hair covers, masks, gowns, and gloves during surgery to keep the surgery area clean.    Give you antibiotics before your surgery starts. in most cases, you should get antibiotics within 60 mInutes before the surgery starts and the antibiotics should be stopped within 24 hours after surgery.    Clean the skin at the site of your surgery with a special soap that kills germs.         What can I do to help prevent SSIs?   Before your surgery:    Tell your doctor about other medical problems you may have. Health problems such as allergies, diabetes, and obesity could affect your surgery and your treatment.    Quit smoking. Patients who smoke get more Infections. Talk to your doctor about how you can quit before your surgery.    Do not shave near where you will have surgery. Shaving with a razor can Irritate your skin and make it easier to develop an infection.         At the time of your surgery:    Speak up if someone tries to shave you with a razor before surgery. Ask why you need to be shaved and talk with your surgeon if you have any concerns.    Ask if you will get antibiotics before surgery.         After your surgery:    Make sure that your healthcare providers clean their hands before examining you, either with soap and water or an alcohol-based hand rub.   If you do not see your healthcare providers wash their hands,   please ask them to do so.     Family and friends who visit you should not touch the surgical wound or dressings.    Family and friends should clean their hands with soap and water or an alcohol-based hand rub before and after visiting you. If you do not see them clean their hands, ask them to clean their hands.   What do I need to do when I go home from the hospital?    Before you go home, your doctor or nurse should explain everyt hing you need to  know about taking care of your wound. Make sure you understand how to care for your wound before you leave the hospital.    Always clean your hands before and after caring for your wound.    Before you go home, make sure you know who to contact If you have questions or problems after you get home.    If you have any symptoms of an Infection, such as redness and pain at the surgery site, drainage, or fever, call your doctor immediately.   if you have additional questions, Please ask your doctor or nurse.

## 2017-05-31 NOTE — Telephone Encounter (Signed)
Please call pt back to answer pre-op questions they have- thank you.

## 2017-05-31 NOTE — Telephone Encounter (Signed)
Returned call and reviewed pre op instructions. Only needs enema prior to coming in and NPO after MN. Husband states understanding.

## 2017-06-02 LAB — EKG 12-LEAD
P: 40 deg
PR: 168 ms
QRS: -60 deg
QRSD: 120 ms
QT: 436 ms
QTc: 450 ms
Rate: 64 {beats}/min
Severity: ABNORMAL
T: 235 deg

## 2017-06-08 ENCOUNTER — Other Ambulatory Visit: Payer: Self-pay | Admitting: Wound

## 2017-06-09 ENCOUNTER — Inpatient Hospital Stay
Admission: RE | Admit: 2017-06-09 | Discharge: 2017-06-10 | Disposition: A | Discharge status: Home / Self Care | Payer: No Typology Code available for payment source | Source: Ambulatory Visit | Attending: Surgery | Admitting: Surgery

## 2017-06-09 ENCOUNTER — Encounter
Admission: RE | Disposition: A | Discharge status: Home / Self Care | Payer: Self-pay | Source: Ambulatory Visit | Attending: Surgery

## 2017-06-09 ENCOUNTER — Inpatient Hospital Stay: Payer: No Typology Code available for payment source | Admitting: Anesthesiology

## 2017-06-09 ENCOUNTER — Inpatient Hospital Stay: Payer: No Typology Code available for payment source | Admitting: Nurse Practitioner

## 2017-06-09 DIAGNOSIS — K621 Rectal polyp: Secondary | ICD-10-CM

## 2017-06-09 DIAGNOSIS — D128 Benign neoplasm of rectum: Secondary | ICD-10-CM | POA: Insufficient documentation

## 2017-06-09 HISTORY — PX: PR RECTAL TUMOR EXCISION TRANSANAL ENDOSCOPIC: 0184T

## 2017-06-09 SURGERY — MICROSURGERY, ENDOSCOPIC, ANAL APPROACH
Anesthesia: General | Site: Buttocks | Wound class: Contaminated

## 2017-06-09 MED ORDER — ENOXAPARIN SODIUM 40 MG/0.4ML IJ SOSY *I*
40.0000 mg | PREFILLED_SYRINGE | Freq: Once | INTRAMUSCULAR | Status: AC
Start: 2017-06-09 — End: 2017-06-09
  Administered 2017-06-09: 40 mg via SUBCUTANEOUS

## 2017-06-09 MED ORDER — EPINEPHRINE HCL 10 MCG/ML IN NS *I*
INTRAMUSCULAR | Status: DC | PRN
Start: 2017-06-09 — End: 2017-06-09
  Administered 2017-06-09: 10 ug via INTRAVENOUS

## 2017-06-09 MED ORDER — IPRATROPIUM-ALBUTEROL 0.5-2.5 MG/3ML IN SOLN *I*
3.0000 mL | Freq: Once | RESPIRATORY_TRACT | Status: AC
Start: 2017-06-09 — End: 2017-06-09

## 2017-06-09 MED ORDER — CITALOPRAM HYDROBROMIDE 40 MG PO TABS *I*
40.0000 mg | ORAL_TABLET | Freq: Every evening | ORAL | Status: DC
Start: 2017-06-09 — End: 2017-06-10
  Administered 2017-06-09: 40 mg via ORAL
  Filled 2017-06-09 (×2): qty 1

## 2017-06-09 MED ORDER — ASPIRIN 81 MG PO CHEW *I*
81.0000 mg | CHEWABLE_TABLET | Freq: Every day | ORAL | Status: DC
Start: 2017-06-09 — End: 2017-06-10
  Administered 2017-06-10: 81 mg via ORAL
  Filled 2017-06-09 (×3): qty 1

## 2017-06-09 MED ORDER — MIDAZOLAM HCL 1 MG/ML IJ SOLN *I* WRAPPED
INTRAMUSCULAR | Status: DC | PRN
Start: 2017-06-09 — End: 2017-06-09
  Administered 2017-06-09 (×2): 1 mg via INTRAVENOUS

## 2017-06-09 MED ORDER — RIZATRIPTAN BENZOATE 10 MG PO TBDP *I*
10.0000 mg | ORAL_TABLET | Freq: Once | ORAL | Status: AC | PRN
Start: 2017-06-09 — End: 2017-06-09
  Filled 2017-06-09: qty 1

## 2017-06-09 MED ORDER — ALBUTEROL SULFATE HFA 108 (90 BASE) MCG/ACT IN AERS *I*
INHALATION_SPRAY | RESPIRATORY_TRACT | Status: DC | PRN
Start: 2017-06-09 — End: 2017-06-09
  Administered 2017-06-09 (×2): 10 via RESPIRATORY_TRACT

## 2017-06-09 MED ORDER — ACETAMINOPHEN 10 MG/ML IV SOLN *I*
INTRAVENOUS | Status: AC
Start: 2017-06-09 — End: 2017-06-09
  Filled 2017-06-09: qty 100

## 2017-06-09 MED ORDER — SUGAMMADEX SODIUM 100 MG/1ML IV SOLN *WRAPPED*
INTRAVENOUS | Status: DC | PRN
Start: 2017-06-09 — End: 2017-06-09
  Administered 2017-06-09: 200 mg via INTRAVENOUS

## 2017-06-09 MED ORDER — ENOXAPARIN SODIUM 40 MG/0.4ML IJ SOSY *I*
PREFILLED_SYRINGE | INTRAMUSCULAR | Status: AC
Start: 2017-06-09 — End: 2017-06-09
  Filled 2017-06-09: qty 0.4

## 2017-06-09 MED ORDER — IPRATROPIUM-ALBUTEROL 0.5-2.5 MG/3ML IN SOLN *I*
RESPIRATORY_TRACT | Status: AC
Start: 2017-06-09 — End: 2017-06-09
  Administered 2017-06-09: 3 mL via RESPIRATORY_TRACT
  Filled 2017-06-09: qty 3

## 2017-06-09 MED ORDER — ONDANSETRON HCL 2 MG/ML IV SOLN *I*
INTRAMUSCULAR | Status: AC
Start: 2017-06-09 — End: 2017-06-09
  Filled 2017-06-09: qty 2

## 2017-06-09 MED ORDER — LEVOTHYROXINE SODIUM 75 MCG PO TABS *I*
75.0000 ug | ORAL_TABLET | Freq: Every day | ORAL | Status: DC
Start: 2017-06-10 — End: 2017-06-10
  Administered 2017-06-10: 75 ug via ORAL
  Filled 2017-06-09 (×2): qty 1

## 2017-06-09 MED ORDER — FENTANYL CITRATE 50 MCG/ML IJ SOLN *WRAPPED*
INTRAMUSCULAR | Status: DC | PRN
Start: 2017-06-09 — End: 2017-06-09
  Administered 2017-06-09: 100 ug via INTRAVENOUS

## 2017-06-09 MED ORDER — ACETAMINOPHEN 500 MG PO TABS *I*
1000.0000 mg | ORAL_TABLET | Freq: Three times a day (TID) | ORAL | Status: DC
Start: 2017-06-10 — End: 2017-06-10

## 2017-06-09 MED ORDER — PROMETHAZINE HCL 25 MG/ML IJ SOLN *I*
12.5000 mg | Freq: Four times a day (QID) | INTRAMUSCULAR | Status: DC | PRN
Start: 2017-06-09 — End: 2017-06-10

## 2017-06-09 MED ORDER — ROCURONIUM BROMIDE 10 MG/ML IV SOLN *WRAPPED*
Status: DC | PRN
Start: 2017-06-09 — End: 2017-06-09
  Administered 2017-06-09 (×2): 10 mg via INTRAVENOUS
  Administered 2017-06-09: 60 mg via INTRAVENOUS

## 2017-06-09 MED ORDER — HALOPERIDOL LACTATE 5 MG/ML IJ SOLN *I*
INTRAMUSCULAR | Status: DC | PRN
Start: 2017-06-09 — End: 2017-06-09
  Administered 2017-06-09: 1 mg via INTRAVENOUS

## 2017-06-09 MED ORDER — MIDAZOLAM HCL 1 MG/ML IJ SOLN *I* WRAPPED
INTRAMUSCULAR | Status: AC
Start: 2017-06-09 — End: 2017-06-09
  Filled 2017-06-09: qty 2

## 2017-06-09 MED ORDER — ERTAPENEM SODIUM 1 GM INJ SOLR (100 MG/ML) *WRAPPED*
Status: AC
Start: 2017-06-09 — End: 2017-06-09
  Filled 2017-06-09: qty 10

## 2017-06-09 MED ORDER — PREGABALIN 75 MG PO CAPS *I*
75.0000 mg | ORAL_CAPSULE | Freq: Three times a day (TID) | ORAL | Status: DC
Start: 2017-06-09 — End: 2017-06-10
  Administered 2017-06-09 – 2017-06-10 (×3): 75 mg via ORAL

## 2017-06-09 MED ORDER — IPRATROPIUM-ALBUTEROL 0.5-2.5 MG/3ML IN SOLN *I*
3.0000 mL | Freq: Four times a day (QID) | RESPIRATORY_TRACT | Status: DC | PRN
Start: 2017-06-09 — End: 2017-06-10

## 2017-06-09 MED ORDER — ERTAPENEM SODIUM 1 GM INJ SOLR (100 MG/ML) *WRAPPED*
1000.0000 mg | Freq: Once | Status: AC
Start: 2017-06-09 — End: 2017-06-09
  Administered 2017-06-09: 1 g via INTRAVENOUS

## 2017-06-09 MED ORDER — ALBUTEROL SULFATE HFA 108 (90 BASE) MCG/ACT IN AERS *I*
INHALATION_SPRAY | RESPIRATORY_TRACT | Status: AC
Start: 2017-06-09 — End: 2017-06-09
  Filled 2017-06-09: qty 6.7

## 2017-06-09 MED ORDER — ONDANSETRON HCL 2 MG/ML IV SOLN *I*
4.0000 mg | Freq: Four times a day (QID) | INTRAMUSCULAR | Status: DC | PRN
Start: 2017-06-09 — End: 2017-06-10

## 2017-06-09 MED ORDER — OXYCODONE HCL 5 MG PO TABS *I*
10.0000 mg | ORAL_TABLET | ORAL | Status: DC | PRN
Start: 2017-06-09 — End: 2017-06-10

## 2017-06-09 MED ORDER — LIDOCAINE HCL 2 % IJ SOLN *I*
INTRAMUSCULAR | Status: DC | PRN
Start: 2017-06-09 — End: 2017-06-09
  Administered 2017-06-09: 100 mg via INTRAVENOUS

## 2017-06-09 MED ORDER — ACETAMINOPHEN 10 MG/ML IV SOLN *I*
1000.0000 mg | Freq: Three times a day (TID) | INTRAVENOUS | Status: AC
Start: 2017-06-09 — End: 2017-06-10
  Administered 2017-06-09 – 2017-06-10 (×3): 1000 mg via INTRAVENOUS

## 2017-06-09 MED ORDER — FENTANYL CITRATE 50 MCG/ML IJ SOLN *WRAPPED*
INTRAMUSCULAR | Status: AC
Start: 2017-06-09 — End: 2017-06-09
  Filled 2017-06-09: qty 2

## 2017-06-09 MED ORDER — PREGABALIN 75 MG PO CAPS *I*
ORAL_CAPSULE | ORAL | Status: AC
Start: 2017-06-09 — End: 2017-06-09
  Filled 2017-06-09: qty 1

## 2017-06-09 MED ORDER — ENOXAPARIN SODIUM 40 MG/0.4ML IJ SOSY *I*
40.0000 mg | PREFILLED_SYRINGE | INTRAMUSCULAR | Status: DC
Start: 2017-06-10 — End: 2017-06-10
  Administered 2017-06-10: 40 mg via SUBCUTANEOUS

## 2017-06-09 MED ORDER — PROPOFOL 10 MG/ML IV EMUL (INTERMITTENT DOSING) WRAPPED *I*
INTRAVENOUS | Status: DC | PRN
Start: 2017-06-09 — End: 2017-06-09
  Administered 2017-06-09: 200 mg via INTRAVENOUS

## 2017-06-09 MED ORDER — ROCURONIUM BROMIDE 10 MG/ML IV SOLN *WRAPPED*
Status: AC
Start: 2017-06-09 — End: 2017-06-09
  Filled 2017-06-09: qty 10

## 2017-06-09 MED ORDER — LIDOCAINE HCL 2 % (PF) IJ SOLN *I*
INTRAMUSCULAR | Status: AC
Start: 2017-06-09 — End: 2017-06-09
  Filled 2017-06-09: qty 5

## 2017-06-09 MED ORDER — MELATONIN 3 MG PO TABS *I*
6.0000 mg | ORAL_TABLET | Freq: Every evening | ORAL | Status: DC | PRN
Start: 2017-06-09 — End: 2017-06-10
  Filled 2017-06-09 (×2): qty 2

## 2017-06-09 MED ORDER — LIDOCAINE HCL (PF) 1 % IJ SOLN *I*
0.1000 mL | INTRAMUSCULAR | Status: DC | PRN
Start: 2017-06-09 — End: 2017-06-09

## 2017-06-09 MED ORDER — SUGAMMADEX SODIUM 100 MG/1ML IV SOLN *WRAPPED*
INTRAVENOUS | Status: AC
Start: 2017-06-09 — End: 2017-06-09
  Filled 2017-06-09: qty 2

## 2017-06-09 MED ORDER — OXYCODONE HCL 5 MG PO TABS *I*
5.0000 mg | ORAL_TABLET | ORAL | Status: DC | PRN
Start: 2017-06-09 — End: 2017-06-10

## 2017-06-09 MED ORDER — SODIUM CHLORIDE 0.9 % IV SOLN WRAPPED *I*
100.0000 mL/h | Status: DC
Start: 2017-06-09 — End: 2017-06-10
  Administered 2017-06-09 (×2): 100 mL/h via INTRAVENOUS

## 2017-06-09 MED ORDER — ONDANSETRON HCL 2 MG/ML IV SOLN *I*
INTRAMUSCULAR | Status: DC | PRN
Start: 2017-06-09 — End: 2017-06-09
  Administered 2017-06-09: 4 mg via INTRAMUSCULAR

## 2017-06-09 MED ORDER — PROPOFOL 10 MG/ML IV EMUL (INTERMITTENT DOSING) WRAPPED *I*
INTRAVENOUS | Status: AC
Start: 2017-06-09 — End: 2017-06-09
  Filled 2017-06-09: qty 20

## 2017-06-09 MED ORDER — SCOPOLAMINE BASE 1.5 MG TD PT72 *I*
1.0000 | MEDICATED_PATCH | Freq: Once | TRANSDERMAL | Status: AC
Start: 2017-06-09 — End: 2017-06-09
  Administered 2017-06-09: 1 via TRANSDERMAL

## 2017-06-09 MED ORDER — HALOPERIDOL LACTATE 5 MG/ML IJ SOLN *I*
INTRAMUSCULAR | Status: AC
Start: 2017-06-09 — End: 2017-06-09
  Filled 2017-06-09: qty 1

## 2017-06-09 MED ORDER — FENTANYL CITRATE 50 MCG/ML IJ SOLN *WRAPPED*
25.0000 ug | INTRAMUSCULAR | Status: DC | PRN
Start: 2017-06-09 — End: 2017-06-09

## 2017-06-09 MED ORDER — MORPHINE SULFATE 2 MG/ML IV SOLN *WRAPPED*
2.0000 mg | Status: DC | PRN
Start: 2017-06-09 — End: 2017-06-10

## 2017-06-09 MED ORDER — DEXAMETHASONE SODIUM PHOSPHATE 4 MG/ML INJ SOLN *WRAPPED*
INTRAMUSCULAR | Status: DC | PRN
Start: 2017-06-09 — End: 2017-06-09
  Administered 2017-06-09: 4 mg via INTRAVENOUS

## 2017-06-09 MED ORDER — SODIUM CHLORIDE 0.9 % IV SOLN WRAPPED *I*
20.0000 mL/h | Status: DC
Start: 2017-06-09 — End: 2017-06-09

## 2017-06-09 MED ORDER — ALLOPURINOL 100 MG PO TABS *I*
100.0000 mg | ORAL_TABLET | Freq: Every morning | ORAL | Status: DC
Start: 2017-06-10 — End: 2017-06-10
  Administered 2017-06-10: 100 mg via ORAL
  Filled 2017-06-09 (×2): qty 1

## 2017-06-09 MED ORDER — DEXAMETHASONE SODIUM PHOSPHATE 4 MG/ML INJ SOLN *WRAPPED*
INTRAMUSCULAR | Status: AC
Start: 2017-06-09 — End: 2017-06-09
  Filled 2017-06-09: qty 1

## 2017-06-09 MED ORDER — LACTATED RINGERS IV SOLN *I*
20.0000 mL/h | INTRAVENOUS | Status: DC
Start: 2017-06-09 — End: 2017-06-09
  Administered 2017-06-09: 20 mL/h via INTRAVENOUS

## 2017-06-09 SURGICAL SUPPLY — 41 items
ASSEMBLY BULB BLADDER CLEAN DISP (Supply) ×3 IMPLANT
BLOWER HAND TEM LF STER DISP (Supply) IMPLANT
BRIEF MATERNITY MESH 2XL LF (Dressing) ×3 IMPLANT
CABLE ENDOSCOPY L10FT MONOPOLAR DISP (Supply) ×1 IMPLANT
CABLE MONOPOLAR DISPOSABLE (Supply) ×3 IMPLANT
CAP MINI OP LAPSCP 5.5MM 3.5MM 3MM 4MM (Supply) ×1 IMPLANT
CAP SEALING 13-14MM (Supply) ×2
CAP SEALING 3-4MM (Supply) ×2
CAP SEALING F/TEM FACEPLATE (Supply) ×3 IMPLANT
CAP SEALING OD13-14MM RED (Supply) ×1 IMPLANT
CATH ROBINSON URETH VINYL 16FR LF (Supply) ×3 IMPLANT
CLIP SUT TI W/ MT DEV (Supply) ×2 IMPLANT
CONTAINER SPECIMEN 4OZ STERILE (Supply) ×3 IMPLANT
DRAPE FLUID WARMER 44 X 44IN LF (Drape) ×3 IMPLANT
DRAPE FOOTSWITCH COVER (Drape) ×6 IMPLANT
DRAPE SHEET 40X70 MED (Drape) ×3 IMPLANT
DRAPE SUR XL W33XL51IN STD CUF L6IN POLYPR SMS LEG PR SFT FLX EC DISP (Drape) IMPLANT
DRESSING ABDOMINAL PAD 5 X 9 LF (Dressing) ×3 IMPLANT
FILTER NEPTUNE 4PORT MANIFOLD (Supply) IMPLANT
GAUZE VASELINE PEELABLE STD 3 X 9IN (Dressing) ×3 IMPLANT
GEL ELECTROLUBE EAGLE SURG (Supply) ×3 IMPLANT
GLOVE BIOGEL PI MICRO IND UNDER SZ 6.0 LF (Glove) ×6 IMPLANT
LEGGING XL (Drape)
LIGASURE RETRACT L-HOOK 37CMLIGASURE (Supply)
NEEDLE HYPO WALL/BVL LF 26G X .5IN TAN (Needle) IMPLANT
PACK CUSTOM GENERAL LAPAROSCOPY (Pack) ×3 IMPLANT
PACK TOWEL LIGHT BLUE STERILE (Supply) ×3 IMPLANT
SEALER LAPAROSCOPIC DIVIDER L37MM DIA5MM L HOOK RETRACTABLE LIGASURE (Supply) IMPLANT
SET TUBING INSUFFLATION ENDO F/ TEM (Supply) IMPLANT
SOL FOG REDUCTION FRED (Supply) ×3 IMPLANT
SOL HYDROGEN PEROXIDE 3PCT 16OZ (Solution) ×3 IMPLANT
SOL SOD CHL IRRIG 500ML BTL (Solution) ×3 IMPLANT
SPONGE HEMSTAT W8XL3CM HEMORRHOIDECTOMY PORCINE GEL H2O INSOLUBLE MAL SURGFOAM (Sponge) ×3 IMPLANT
SUTR PDS II MONO 2-0 SH 27IN VIOLET (Suture) IMPLANT
SUTR VLOC 180 3-0 SK-23 GREEN (Suture) ×2 IMPLANT
SYRINGE 50ML 2 OZ CATH-TIP NL (Supply) ×3 IMPLANT
SYRINGE ONLY 30ML SLIP TIP (Supply) ×3 IMPLANT
TEM REPLACEM SIL SLEEVES PKG 3 (Other) ×3 IMPLANT
TRAY FOLEY SIL DRAIN BAG 14FR (Tray) ×3 IMPLANT
TUBE SIGMOIDOSCOPE SUCT 18FRX30CM (Supply) ×3 IMPLANT
TUBING NONCONDUC CONN 12FT X 3/16IN (Tubing) ×3 IMPLANT

## 2017-06-09 NOTE — Progress Notes (Signed)
1305- Patient is responsive and oriented. Denies nausea. No complaints of pain,  Small amount serosanguinous drainage on ctal dressing. Patient was medicated for wheezing with duoneb  Nebulizer with relief. Wheezing returned. Medicated with epinephrine by Dr. Billie Lade with good relief. Breathe sounds remain clear. Encouraging deep breathing and incentive spirometry.  Dr. Tobin Chad postoped patient. May go to [facility]. Report given to [facility]. RN 97 % oxygen saturation on room air.

## 2017-06-09 NOTE — Plan of Care (Signed)
Alteration in comfort related to surgical procedure     Pain is relieved or minimized Maintaining        Alteration in respiratory function     Sa02 > 92% or at pre-op level Maintaining        Alteration of skin integrity related to surgical procedure     Maintain skin integrity with incision line approximated Maintaining          Alteration in elimination     Adequate urinary output Progressing towards goal

## 2017-06-09 NOTE — Progress Notes (Signed)
06/09/17 1542   [facility] Patient Class Review   Patient Class Review Outpatient     Patient Class SSC 23 Effective as of 06/09/2017    Mosie Epstein, RN   Pager: [phone]

## 2017-06-09 NOTE — Interval H&P Note (Signed)
UPDATES TO PATIENT'S CONDITION on the DAY OF SURGERY/PROCEDURE    I. Updates to Patient's Condition (to be completed by a provider privileged to complete a H&P, following reassessment of the patient by the provider):    Day of Surgery/Procedure Update:  History  History reviewed and no change    Physical  Physical exam updated and no change            II. Procedure Readiness   I have reviewed the patient's H&P and updated condition. By completing and signing this form, I attest that this patient is ready for surgery/procedure.    III. Attestation   I have reviewed the updated information regarding the patient's condition and it is appropriate to proceed with the planned surgery/procedure.    Doren Custard, MD as of 8:15 AM 06/09/2017

## 2017-06-09 NOTE — Progress Notes (Signed)
Pt tolerating clear liquid diet.  Rectal packing remains in place.  Continue to monitor output of foley catheter.  Pt ambulated around unit and is currently resting comfortably in recliner with call bell in reach.    Rudean Hitt, RN

## 2017-06-09 NOTE — Progress Notes (Signed)
Colorectal Surgery Post Procedure Note    PROCEDURE  TEMS for Rectal Polyp    SUBJECTIVE  Patient is doing well post operatively.   Denies nausea or vomiting, denies flatus or BM  Pain controlled. Has taken a few sips. IV fluids running.  Denies chest pain, SOB, dizzy or lightheaded.     OBJECTIVE  Physical Exam:  Temp:  [36 C (96.8 F)-36.4 C (97.5 F)] 36.3 C (97.3 F)  Heart Rate:  [73-96] 73  Resp:  [13-23] 16  BP: (98-152)/(66-96) 117/66  GEN: no apparent distress  HEENT: face symmetric  CHEST: nonlabored respirations  ABD: soft, nondistended, nontender  GU: Foley in place with clear yellow urine  Perineal: rectal packing in place, saturated with serosang coloring   NEURO/MOTOR: alert  EXTREMITIES: atraumatic  VASCULAR: feet wwp    ASSESSMENT  Brandi Warner is a 61 y.o. female with h/o rectal polyp who is now s/p TEMS procedure 06/09/17. Pt is doing well post operatively.     PLAN    Diet: CLD, NS@100ml /hr  Foley tonight  Gout: Allopurinol  A&D: celexa, hold xanax in immediate post operative setting  Analgesia: tylenol, oxycodone IR PRN, Lyrica  Antiemetics prn  Hypothyroid: Synthroid restarted  Home meds  OOB/ambulate, DVT ppx Lovenox 40mg  QD  Pulmonary toilet        Author: Erma Pinto, PA  as of: 06/09/2017  at: 3:12 PM

## 2017-06-09 NOTE — H&P (View-Only) (Signed)
Allergies   Allergen Reactions    No Known Drug Allergy      Created by Conversion - 0;        Current Outpatient Prescriptions   Medication    ALPRAZolam (XANAX) 0.25 MG tablet    ergocalciferol (DRISDOL) 50000 UNIT capsule    esomeprazole (NEXIUM) 40 MG capsule    levothyroxine (SYNTHROID, LEVOTHROID) 75 MCG tablet    rizatriptan (MAXALT) 10 MG tablet    allopurinol (ZYLOPRIM) 100 MG tablet    zolpidem (AMBIEN) 10 MG tablet    aspirin 81 MG tablet    Multiple Vitamins-Minerals (MULTIVITAMIN ADULT PO)    Cyanocobalamin (VITAMIN B 12 PO)    varenicline (CHANTIX STARTING MONTH PAK) 0.5 MG X 11 & 1 MG X 42 tablet pak    citalopram (CELEXA) 40 MG tablet     No current facility-administered medications for this visit.        Patient Active Problem List   Diagnosis Code    Hypothyroidism E03.9       Past Medical History:   Diagnosis Date    Anxiety     Aortic valve disease     Arthritis     Barrett's esophagus     Colon polyp     COPD (chronic obstructive pulmonary disease)     Depression     GERD (gastroesophageal reflux disease)     Gout     Hiatal hernia     Migraine     Thyroid disease        Past Surgical History:   Procedure Laterality Date    CHOLECYSTECTOMY      Cholecystectomy Conversion Data     CONVERTED PROCEDURE      Nasal Endoscopy Polypectomy Conversion Data     CONVERTED PROCEDURE      Oophorectomy Unilateral Left Side Conversion Data     OVARIAN CYST SURGERY      patient does not remember which side       Family History   Problem Relation Age of Onset    Pacemaker Mother     Heart surgery Mother 59     3 bypass    COPD Brother        Social History     Social History    Marital status: Married     Spouse name: N/A    Number of children: N/A    Years of education: N/A     Occupational History    Not on file.     Social History Main Topics    Smoking status: Current Every Day Smoker     Packs/day: 1.00     Years: 27.00     Types: Cigarettes     Start date: 09/10/1977     Smokeless tobacco: Never Used    Alcohol use No    Drug use: No    Sexual activity: Not on file     Social History Narrative            Chief Complaint   Patient presents with    New Patient Visit       History of Present Illness  Brandi Warner is a 61 year old female who presents today for surgical consultation regarding unresectable rectal polyp. She has had this polyp for over a year and was getting removed by piecemeal until her  Endoscopist passed away. She then moved to [place] this past year and Dr. Jone Baseman tried in June and just recently to remove the polyp  but could not. Pathology revealed tubulovillous adenoma. She is not having difficulty with BM's and denies pain or bleeding. Denies family hx of colorectal cancer.     Review of Systems:  Pertinent items are noted in HPI.    Vitals:    05/17/17 0950   BP: 138/90   Pulse: 83   Temp: 36.7 C (98 F)   Weight: 99.1 kg (218 lb 6.4 oz)   Height: 1.651 m (5\' 5" )     Physical Exam:  General: well developed, well nourished  Orientation: alert and oriented times three  Eyes: anicteric sclera  Neuro: non-focal  Lungs: clear to auscultation bilaterally  Cardiac: regular rate and rhythm  A complete GI exam was performed.  Details include: BS normal.  Abdomen soft, non-tender.  No masses or organomegaly  Anorectal exam deferred by patient.       Assessment   1. Rectal polyp            Plan:  Rhylen presents for evaluation of a recurrent rectal polyp.  Pathology reveals tubulovillous adenoma.  This can likely be removed by TEMS.  Since she is nervous we will plan for evaluation by proctoscopy with some sedation later today for surgical planning. The risks (bleeding, infection, perforation, recurrence). , benefits and alternatives of TEMS resection were discussed with the patient who agrees to proceed.

## 2017-06-09 NOTE — Anesthesia Postprocedure Evaluation (Signed)
Anesthesia Post-Op Note    Patient: Brandi Warner    Procedure(s) Performed:  Procedure Summary  Date:  06/09/2017 Anesthesia Start: 06/09/2017  8:29 AM Anesthesia Stop: 06/09/2017 10:08 AM Room / Location:  [id number] / [facility] MAIN OR   Procedure(s):  TRANSANAL ENDOSCOPIC MICROSURGERY (TEMS)  Diagnosis:  Rectal polyp [K62.1] Surgeon(s):  Doren Custard, MD Attending Anesthesiologist:  Pryor Ochoa, MD         Recovery Vitals  BP: 122/72 (06/09/2017  4:27 PM)  Heart Rate: 72 (06/09/2017  4:27 PM)  Heart Rate (via Pulse Ox): 83 (06/09/2017  1:00 PM)  Resp: 16 (06/09/2017  4:27 PM)  Temp: 36.5 C (97.7 F) (06/09/2017  4:27 PM)  SpO2: 94 % (06/09/2017  4:27 PM)  O2 Flow Rate: 2 L/min (06/09/2017  2:37 PM)   0-10 Scale: 0 (06/09/2017  2:37 PM)  Anesthesia type:  General  Complications Noted During Procedure or in PACU:  None   Comment:    Patient Location:  PACU  Level of Consciousness:    Recovered to baseline  Patient Participation:     Able to participate  Temperature Status:    Normothermic  Oxygen Saturation:    Within patient's normal range  Cardiac Status:   Within patient's normal range  Fluid Status:    Stable  Airway Patency:     Yes  Pulmonary Status:    Stable  Pain Management:    Adequate analgesia  Nausea and Vomiting:  None  Comments:    Patient received duoneb in pacu. Wheezing resolved and shortly returned. 21mcg IV epinephrine given by me at bedside in PACU. Patient wheezing resolved and saturated in high 90s on RA. Patient also no longer symptomatic   Post Op Assessment:    Tolerated procedure well   Attending Attestation:  All indicated post anesthesia care provided     -

## 2017-06-09 NOTE — Anesthesia Procedure Notes (Signed)
---------------------------------------------------------------------------------------------------------------------------------------    AIRWAY   GENERAL INFORMATION AND STAFF    Patient location during procedure: OR       Date of Procedure: 06/09/2017 8:47 AM  CONDITION PRIOR TO MANIPULATION     Current Airway/Neck Condition:  Normal        For more airway physical exam details, see Anesthesia PreOp Evaluation  AIRWAY METHOD     Patient Position:  Sniffing    Preoxygenated: yes      Induction: IV and NDMR    Mask Difficulty Assessment:  2 - vent by mask + OPA/NPA      Mask NMB: 2 - vent by mask + OPA/NPA      Technique Used for Successful ETT Placement:  Direct laryngoscopy    Devices/Methods Used in Placement:  Intubating stylet    Blade Type:  Macintosh    Laryngoscope Blade/Video laryngoscope Blade Size:  4    Cormack-Lehane Classification:  Grade I - full view of glottis    Placement Verified by: capnometry, auscultation, palpation of cuff and equal breath sounds      Number of Attempts at Approach:  1  FINAL AIRWAY DETAILS    Final Airway Type:  Endotracheal airway    Adjunct Airway: soft bite block    Final Endotracheal Airway:  ETT      Cuffed: cuffed    Insertion Site:  Oral    ETT Size (mm):  7.5    Cuff Volume (mL):  7    Distance inserted from Lips (cm):  21  ADDITIONAL COMMENTS   Atraumatic, teeth/mouth as prior.  ----------------------------------------------------------------------------------------------------------------------------------------

## 2017-06-09 NOTE — Plan of Care (Signed)
Alteration in comfort related to surgical procedure     Pain is relieved or minimized Maintaining        Alteration in elimination     Adequate urinary output Maintaining          Alteration in respiratory function     Sa02 > 92% or at pre-op level Progressing towards goal        Alteration of skin integrity related to surgical procedure     Maintain skin integrity with incision line approximated Progressing towards goal          Patient arrived to unit without any c/o pain.  Foley catheter output monitored and IVF as ordered.  Educated patient on current clear liquid diet for bowel rest.  Continued assessment of ABD pad drainage as actual surgical site is internal.

## 2017-06-09 NOTE — Anesthesia Case Conclusion (Signed)
CASE CONCLUSION  Emergence  Actions:  Suctioned, soft bite block and extubated  Criteria Used for Airway Removal:  Adequate Tv & RR, acceptable O2 saturation, following commands and sustained tetany  Assessment:  Routine  Transport  Directly to: PACU  Airway:  Nasal cannula  Oxygen Delivery:  4 lpm  Position:  Recumbent  Patient Condition on Handoff  Level of Consciousness:  Mildly sedated  Patient Condition:  Stable  Handoff Report to:  RN  Comments: Teeth/mouth as prior.

## 2017-06-09 NOTE — Op Note (Signed)
Operative Note (Surgical Log ID: [id number])       Date of Surgery: 06/09/2017       Surgeons: Juliann Mule) and Role:     * Doren Custard, MD - Primary       Pre-op Diagnosis: Pre-Op Diagnosis Codes:     * Rectal polyp [K62.1]       Post-op Diagnosis: Post-Op Diagnosis Codes:     * Rectal polyp [K62.1]       Procedure(s) Performed: Procedure:    TRANSANAL ENDOSCOPIC MICROSURGERY (TEMS)   CPT(R) Code:  7989Q - PR RECTAL TUMOR EXCISION, TRANSANAL ENDOSCOPIC MICROSURGICAL, FULL THICK         Additional CPT Codes:        Anesthesia Type: General        Fluid Totals:         Estimated Blood Loss: Blood Loss: 5 mL       Specimens to Pathology:    ID Type Source Tests Collected by Time Destination   A : TEMS specimen,  rectal polyp (lithotomy position at 6:00) TISSUE Tissue SURGICAL PATHOLOGY Sheryle Hail, RN 06/09/2017 1194           Temporary Implants:        Packing:                 Patient Condition: good       Indications: 61 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  She presents for TEMS resection       Findings (Including unexpected complications):      Description of Procedure: After informed consent was obtained, the patient was taken to the operating room and placed in supine position on the operating room table.  General endotracheal anesthesia was induced and the endotracheal tube secured in place.  Venodynes were placed on bilateral lower extremities and Lovenox given preoperatively for DVT prophylaxis.  A Foley catheter was placed without difficulty.  IV antibiotics were given within one hour of the incision.  After surgical pause a rigid proctoscope was introduced into the anal canal.  The lesion of question was seen in the posterior position at the level of the 2nd valve, about 2 cm in size.    The proctoscope was again introduced into the anal canal with the lesion  in the posterior position.  The TEMS operating rectoscope was introduced into the anal canal.  The microscope was inserted.  The  lesion was identified.  The rectum was insufflated using the Wolf insufflator.  Cautery was used to score circumferentially around the lesion with 1 cm margins.  The dissection was started at the distal edge. As the dissection came around laterally and superiorly the mesorectum was encountered and the dissection taken through the mesorectal fat.  The lesion was completely excised.  Hydroperoxide was used to irrigate the wound bed.  Hemostasis was assured.  The wound bed was meticulously inspected with the microscope and there did not appear to be any peritoneal breaches. The defect was closed with a running V-loc suture.  Polysporin ointment was applied and a thrombin Gelfoam packing was inserted into the anal canal.  The patient tolerated the procedure well, was extubated and taken to the recovery room in stable condition.  At the end of procedure all sponge, needle, instrument counts were correct x2.  I., Doren Custard performed the entire procedure.      Signed:  Doren Custard, MD  on 06/09/2017 at 4:15 PM

## 2017-06-10 ENCOUNTER — Encounter: Payer: Self-pay | Admitting: Surgery

## 2017-06-10 LAB — CBC AND DIFFERENTIAL
Baso # K/uL: 0 10*3/uL (ref 0.0–0.1)
Basophil %: 0.1 %
Eos # K/uL: 0 10*3/uL (ref 0.0–0.4)
Eosinophil %: 0 %
Hematocrit: 41 % (ref 34–45)
Hemoglobin: 13.8 g/dL (ref 11.2–15.7)
IMM Granulocytes #: 0 10*3/uL (ref 0.0–0.1)
IMM Granulocytes: 0.2 %
Lymph # K/uL: 1.1 10*3/uL — ABNORMAL LOW (ref 1.2–3.7)
Lymphocyte %: 11.3 %
MCH: 32 pg/cell (ref 26–32)
MCHC: 33 g/dL (ref 32–36)
MCV: 97 fL — ABNORMAL HIGH (ref 79–95)
Mono # K/uL: 0.4 10*3/uL (ref 0.2–0.9)
Monocyte %: 4.6 %
Neut # K/uL: 7.9 10*3/uL — ABNORMAL HIGH (ref 1.6–6.1)
Nucl RBC # K/uL: 0 10*3/uL (ref 0.0–0.0)
Nucl RBC %: 0 /100 WBC (ref 0.0–0.2)
Platelets: 231 10*3/uL (ref 160–370)
RBC: 4.3 MIL/uL (ref 3.9–5.2)
RDW: 14 % (ref 11.7–14.4)
Seg Neut %: 83.8 %
WBC: 9.4 10*3/uL (ref 4.0–10.0)

## 2017-06-10 LAB — BASIC METABOLIC PANEL
Anion Gap: 11 (ref 7–16)
CO2: 24 mmol/L (ref 20–28)
Calcium: 8.6 mg/dL (ref 8.6–10.2)
Chloride: 108 mmol/L (ref 96–108)
Creatinine: 0.72 mg/dL (ref 0.51–0.95)
GFR,Black: 105 *
GFR,Caucasian: 91 *
Glucose: 124 mg/dL — ABNORMAL HIGH (ref 60–99)
Lab: 8 mg/dL (ref 6–20)
Potassium: 4.4 mmol/L (ref 3.3–5.1)
Sodium: 143 mmol/L (ref 133–145)

## 2017-06-10 LAB — PHOSPHORUS: Phosphorus: 3.4 mg/dL (ref 2.7–4.5)

## 2017-06-10 LAB — CRP: CRP: 7 mg/L (ref 0–10)

## 2017-06-10 LAB — MAGNESIUM: Magnesium: 1.5 mEq/L (ref 1.3–2.1)

## 2017-06-10 MED ORDER — PREGABALIN 75 MG PO CAPS *I*
ORAL_CAPSULE | ORAL | Status: AC
Start: 2017-06-10 — End: 2017-06-10
  Filled 2017-06-10: qty 1

## 2017-06-10 MED ORDER — ENOXAPARIN SODIUM 40 MG/0.4ML IJ SOSY *I*
PREFILLED_SYRINGE | INTRAMUSCULAR | Status: AC
Start: 2017-06-10 — End: 2017-06-10
  Filled 2017-06-10: qty 0.4

## 2017-06-10 MED ORDER — ACETAMINOPHEN 500 MG PO TABS *I*
ORAL_TABLET | ORAL | Status: DC
Start: 2017-06-10 — End: 2017-06-10
  Filled 2017-06-10: qty 2

## 2017-06-10 MED ORDER — ACETAMINOPHEN 10 MG/ML IV SOLN *I*
INTRAVENOUS | Status: AC
Start: 2017-06-10 — End: 2017-06-10
  Filled 2017-06-10: qty 100

## 2017-06-10 NOTE — Progress Notes (Signed)
Patient A+O x3, denies any pain or discomfort. Dressing/packing to rectum intact with small amount old serosanguinous drainage. Patient with +flatus. Ambulating without difficulty and tolerating clear liquids. Call light is within reach. Aurelio Jew, RN

## 2017-06-10 NOTE — Progress Notes (Signed)
Colorectal Surgery Progress Note    PROCEDURE  TEMS for Rectal Polyp 06/09/17    SUBJECTIVE  Doing well this am , without complaints. Pain well controlled. Tolerated CLD, voiding well from foley. denies BM or Gas. Rectal foam in place. Denies chest pain, shortness of breath, lightheadedness or dizzy.     OBJECTIVE  Physical Exam:  Temp:  [36 C (96.8 F)-36.5 C (97.7 F)] 36.3 C (97.3 F)  Heart Rate:  [65-93] 74  Resp:  [13-23] 16  BP: (98-150)/(58-89) 119/72  GEN: no apparent distress  HEENT: face symmetric  CHEST: nonlabored respirations  ABD: soft, nondistended, nontender  GU: Foley in place with clear yellow urine  Perineal: rectal packing in place, saturated with serosang coloring   NEURO/MOTOR: alert  EXTREMITIES: atraumatic  VASCULAR: feet wwp    ASSESSMENT  Brandi Warner is a 61 y.o. female with h/o rectal polyp who is now s/p TEMS procedure 06/09/17. Pt is doing well post operatively.     PLAN    Diet: regular diet, no need for IVF  Foley removal and void trial   Gout: Allopurinol  A&D: celexa, hold xanax in immediate post operative setting  Analgesia: tylenol, oxycodone IR PRN, Lyrica  Antiemetics prn  Hypothyroid: Synthroid restarted  Home meds  OOB/ambulate, DVT ppx Lovenox 40mg  QD  Pulmonary toilet    Anticipate discharge today with void trial and diet toleration      Author: Erma Pinto, PA  as of: 06/10/2017  at: 10:12 AM

## 2017-06-10 NOTE — Discharge Instructions (Signed)
Division of [facility]    Brief Summary of your Hospital Stay:    You were admitted to the hospital for elective surgery. On 06/09/17 you underwent TEMS procedure with Dr. Vickie Epley, Colorectal Surgery. You progressed well following surgery. Your pain was controlled with IV medications and you transitioned to oral pain medication successfully. Your urinary catheter was removed and you urinated without difficulty. Your diet was advanced and you tolerated solid food without nausea or vomiting.  You are ambulating successfully at baseline.  You are safe for discharge to home on hospital day 1.     Call [place] for...   Chills or a fever greater than 118f  Please record the value first followed by Tylenol (if needed)   If your incision becomes red, swollen, drains pus, foul odor or drains a copious amount   Uncontrollable nausea and vomiting   Severe pain not relieved by medications   Watery (unformed stool) and foul smelling diarrhea   Calf pain, noticeable warmth and/or leg swelling   Chest pain and/or shortness of breath and jaw pain   Poor appetite   Feeling dehydrated or inability to stay hydrated   Poor urinary output   Any other major medical concerns    Please note: non-emergent phone calls will be answered by the end of the business day.     Appointments and Services     Colorectal Surgery Follow Up Appointment:    06/21/2017 9:45 AM CDoren Custard MD Colorectal Surgery Division [phone]       If you have questions or issues prior to your follow up appointment, you can call the floor colorectal NP/PA Monday-Friday 7 am - 5 pm at [phone]     Diet    Regular without formal restrictions (as tolerated by you).   Eat small frequent meals throughout the day.   Chew food very thoroughly.    Re-introduce new foods gradually - this is not the time to be adventurous with food.    Keep in mind: if you were sensitive to certain foods before  surgery (ex. milk, spicy or fried foods) they will most likely continue to bother you after surgery.      Activity    Continue activity as tolerated.    Continue to be out of bed at least 8 hours a day.    You should be walking at least 4 times daily. Stairs are encouraged.   Continue doing your deep breathing exercises to prevent pneumonia.    Do not lift greater than 10 lbs (a gallon of milk) for 6 weeks.    Please reserve questions regarding the appropriate timing of returning to specific activities after surgery for the Surgical Team at your follow up appointment.    Surgical Incision(s), Wounds,  Lines and Drains    Sitz Baths: 3- 4 x per day or after bowel movements    This is a warm, shallow bath that cleanses the space between the rectum and the vulva (female) or scrotum (female) to provide relief from pain and maintain personal hygiene in the affected area.   You can give yourself a sitz bath in your bathtub or with a plastic kit that fits over your toilet.    PLEASE NOTE: Your incision is open and will drain. This is part of the healing process.    There is no need to pack the wound.    IF the drainage becomes puss like, you  experience increased crescendo like pain or develop a fever please call the office at the above number provided.     A rectal foam was placed, this will come out on its own, most likely with first bowel movement.     Medications      A list of medication changes has been included in your discharge packet. These medications were reviewed prior to your discharge. Please call the Colorectal Surgery office at [phone] with concerns or contact your primary care provider for instructions on medications you were taking prior to coming in the hospital.      Soft Stool: it is very important to keep your stool soft, please use over the counter Miralax and Metamucil powder    Pain Control   Use over the counter TYLENOL / IBUPROFEN around the clock to provide a pain control  base.   Try to take anti-inflammatory medications with food. This will minimize stomach irritation.    Should you develop nausea and vomiting from the pain medication, or develop a rash, please stop taking the medication and contact the office.    Be careful not to exceed 3,000 mg of acetaminophen in a day from all sources (Tylenol with Codeine, Norco, Vicodin, and Percocet contain acetaminophen).

## 2017-06-10 NOTE — Plan of Care (Signed)
Alteration in comfort related to surgical procedure     Pain is relieved or minimized Adequate for discharge        Alteration in elimination     Adequate urinary output Adequate for discharge        Alteration of skin integrity related to surgical procedure     Maintain skin integrity with incision line approximated Adequate for discharge        VSS. Afebrile. Up ad lib. Denies pain. Old serosanguinous drainage on dressing. Fresh pad in place. Breakfast eaten. Voided. Pt meets goals for discharge. Pt expressed interest in discharge this morning. Gerre Pebbles RN

## 2017-06-10 NOTE — Discharge Summary (Signed)
Name: Brandi Warner MRN: [id number] DOB: August 03, 1956     Admit Date: 06/09/2017   Date of Discharge: 06/10/2017    Patient was accepted for discharge to   Home or Self Care [1]           Discharge Attending Physician: Doren Custard      Hospitalization Summary    CONCISE NARRATIVE:      The patient is a 61 y.o. female with a PMH significant for rectal polyp admitted for elective surgery. Brandi Warner underwent TEMS on 06/09/17 with Dr.Cellini, Colorectal Surgery. Patient tolerated the surgery as expected. Patient's pain was controlled with IV and then oral medications. The foley catheter was discontinued and patient voided without difficulty. Diet was advanced and patient tolerated a regular diet without nausea or vomiting.  Patient's abdomen is soft and nondistended. The incision is clean and dry. Patient has ambulated without assistance.  Patient has received teachings on pain control and importance of soft stools. Patient is safe for discharge and has been given instructions for follow-up.          OR PROCEDURE:      Date of Surgery: 06/09/2017     Surgeons:    Doren Custard, MD - Primary     Pre-op Diagnosis:    * Rectal polyp [K62.1]     Post-op Diagnosis:    * Rectal polyp [K62.1]     Procedure(s) Performed: Procedure:    TRANSANAL ENDOSCOPIC MICROSURGERY (TEMS)   CPT(R) Code:  8003K - PR RECTAL TUMOR EXCISION, TRANSANAL ENDOSCOPIC MICROSURGICAL, FULL THICK          PENDING PATHOLOGY RESULTS: Specimen A: TEMS rectal polyp specimen   SIGNIFICANT MED CHANGES: None      Signed: Erma Pinto, PA  On: 06/10/2017  at: 10:26 AM

## 2017-06-15 LAB — SURGICAL PATHOLOGY

## 2017-06-21 ENCOUNTER — Encounter: Payer: Self-pay | Admitting: Surgery

## 2017-06-21 ENCOUNTER — Ambulatory Visit: Payer: No Typology Code available for payment source | Attending: Gastroenterology | Admitting: Surgery

## 2017-06-21 VITALS — BP 123/82 | HR 85 | Temp 96.7°F | Resp 18 | Ht 65.0 in | Wt 218.7 lb

## 2017-06-21 DIAGNOSIS — K621 Rectal polyp: Secondary | ICD-10-CM

## 2017-06-21 NOTE — Patient Instructions (Signed)
Flex sig with me to evaluate surgery site in 6 months ,  Check with Dr. Jone Baseman when she wants to repeat a full colonoscopy

## 2017-06-21 NOTE — Progress Notes (Signed)
Chief Complaint   Patient presents with    Post-op       History of Present Illness  Brandi Warner comes in today, accompanied by her husband for post operative follow up.  She underwent TEMS on 05/2017.  She feels very good.    She did not take anything for pain after the procedure and saw blood in the stool for about two days.  Denies perianal pain, discomfort, anal seepage, burning, further rectal bleeding.  Her bowel movements are softly formed 2-3 per day without straining.  Her appetite is stable.        Review of Systems:  Pertinent items are noted in HPI.    Vitals:    06/21/17 0917   BP: 123/82   Pulse: 85   Resp: 18   Temp: 35.9 C (96.7 F)   Weight: 99.2 kg (218 lb 11.2 oz)   Height: 1.651 m (5\' 5" )     Physical Exam:  General: well developed, well nourished  Orientation: alert and oriented times three  Eyes: anicteric sclera  Neuro: non-focal  A complete GI exam was performed.  Details include:  Abdomen soft NTND          Assessment   1. Rectal polyp s/p TEMS procedure         Plan:  Brandi Warner presents for postoperative followup after TEMS resection of a rectal polyp.  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 I would like to evaluate the TEMS site with a flexible sigmoidoscopy in 6 months. She will check with Dr. Jone Baseman as to when she would like her to repeat her full colonoscopy.

## 2017-08-04 ENCOUNTER — Other Ambulatory Visit: Payer: Self-pay | Admitting: Family Medicine

## 2017-08-04 ENCOUNTER — Encounter: Payer: No Typology Code available for payment source | Admitting: Radiology

## 2017-08-04 DIAGNOSIS — M8588 Other specified disorders of bone density and structure, other site: Secondary | ICD-10-CM

## 2017-08-04 DIAGNOSIS — N6011 Diffuse cystic mastopathy of right breast: Secondary | ICD-10-CM

## 2017-08-04 DIAGNOSIS — Z1231 Encounter for screening mammogram for malignant neoplasm of breast: Secondary | ICD-10-CM

## 2017-08-04 DIAGNOSIS — Z780 Asymptomatic menopausal state: Secondary | ICD-10-CM

## 2017-09-03 ENCOUNTER — Other Ambulatory Visit: Payer: Self-pay | Admitting: Family Medicine

## 2017-09-03 DIAGNOSIS — Z1239 Encounter for other screening for malignant neoplasm of breast: Secondary | ICD-10-CM

## 2017-10-05 ENCOUNTER — Other Ambulatory Visit
Admission: RE | Admit: 2017-10-05 | Discharge: 2017-10-05 | Disposition: A | Discharge status: Home / Self Care | Payer: No Typology Code available for payment source | Source: Ambulatory Visit | Attending: Family Medicine | Admitting: Family Medicine

## 2017-10-05 DIAGNOSIS — F4310 Post-traumatic stress disorder, unspecified: Secondary | ICD-10-CM | POA: Insufficient documentation

## 2017-10-07 LAB — DRUG SCREEN PANEL, EMERGENCY
Amphetamine,UR: NEGATIVE
Barbiturate,UR: NEGATIVE
Benzodiazepinen,UR: NEGATIVE
Cocaine/Metab,UR: NEGATIVE
Opiates,UR: NEGATIVE
Propoxyphene,UR: NEGATIVE
THC Metabolite,UR: NEGATIVE

## 2018-03-10 ENCOUNTER — Other Ambulatory Visit: Payer: Self-pay | Admitting: Cardiology

## 2018-03-10 DIAGNOSIS — I351 Nonrheumatic aortic (valve) insufficiency: Secondary | ICD-10-CM

## 2018-03-17 ENCOUNTER — Ambulatory Visit
Admission: RE | Admit: 2018-03-17 | Discharge: 2018-03-17 | Disposition: A | Discharge status: Home / Self Care | Payer: No Typology Code available for payment source | Source: Ambulatory Visit | Attending: Cardiology | Admitting: Cardiology

## 2018-03-17 ENCOUNTER — Ambulatory Visit: Payer: No Typology Code available for payment source | Admitting: Cardiology

## 2018-03-17 ENCOUNTER — Encounter: Payer: Self-pay | Admitting: Cardiology

## 2018-03-17 VITALS — BP 130/82 | HR 84 | Ht 65.0 in | Wt 208.0 lb

## 2018-03-17 DIAGNOSIS — I351 Nonrheumatic aortic (valve) insufficiency: Secondary | ICD-10-CM

## 2018-03-17 LAB — ECHO COMPLETE
AR CWD Gradient (peak): 87 mmHg
AR Velocity (peak): 466.4 cm/s
Aortic Arch Diameter: 2.3 cm
Aortic Diameter (mid tubular): 3.2 cm
Aortic Diameter (sinus of Valsalva): 3.1 cm
BMI: 34.7 kg/m2
BP Diastolic: 82 mmHg
BP Systolic: 130 mmHg
BSA: 2.08 m2
Deceleration Time - AR: 1542.6 ms
Deceleration Time - MV: 167 ms
E/A ratio: 0.62
Heart Rate: 84 {beats}/min
Height: 65 in
IVC Diameter: 1.4 cm
LA Diameter BSA Index: 1.4 cm/m2
LA Diameter Height Index: 1.8 cm/m
LA Diameter: 2.9 cm
LA Systolic Vol BSA Index: 13.4 mL/m2
LA Systolic Vol Height Index: 16.8 mL/m
LA Systolic Volume: 27.8 mL
LV ASE Mass BSA Index: 70.7 gm/m2
LV ASE Mass Height 2.7 Index: 38 gm/m2.7
LV ASE Mass Height Index: 89 gm/m
LV ASE Mass: 147 gm
LV Posterior Wall Thickness: 1.1 cm
LV Septal Thickness: 1 cm
LVED Diameter BSA Index: 2 cm/m2
LVED Diameter Height Index: 2.5 cm/m
LVED Diameter: 4.2 cm
LVES Diameter BSA Index: 1.2 cm/m2
LVES Diameter Height Index: 1.5 cm/m
LVES Diameter: 2.5 cm
LVOT Area (calculated): 2.98 cm2
LVOT Cardiac Index: 2.7 L/min/m2
LVOT Cardiac Output: 5.62 L/min
LVOT Diameter: 1.95 cm
LVOT PWD VTI: 22.4 cm
LVOT PWD Velocity (mean): 87.1 cm/s
LVOT PWD Velocity (peak): 122.4 cm/s
LVOT SV BSA Index: 32.15 mL/m2
LVOT SV Height Index: 40.5 mL/m
LVOT Stroke Rate (mean): 260 mL/s
LVOT Stroke Rate (peak): 365.4 mL/s
LVOT Stroke Volume: 66.86 cc
MV Peak A Velocity: 77.7 cm/s
MV Peak E Velocity: 47.9 cm/s
Pressure Half-Time - AR: 447.3 ms
RR Interval: 714.29 ms
RVED Diameter BSA Index: 1.1 cm/m2
RVED Diameter Height Index: 1.4 cm/m
RVED Diameter: 2.26 cm
Weight (lbs): 208 [lb_av]
Weight: 3328 oz

## 2018-03-17 NOTE — Progress Notes (Signed)
Cardiology Office Revisit Note    Date of Visit: 03/17/2018 Patient: Brandi Warner   Patients PCP: Georgana Curio, MD Patient DOB: Apr 13, 1956     Subjective/Reason For Visit     I had the pleasure of seeing Brandi Warner in cardiology followup on 03/17/2018 for her aortic valve regurgitation.  She was last seen in February of 2018.    Today, Brandi Warner describes recent episodes of lightheadedness.  These are occurring whenever she lays down or turns her head.  She denies the sensation that she is moving or the room is moving, but rather describes almost blacking out/passing out.  She has had vertigo in the past with symptoms of dizziness occurring with similar activities, and she feels these recent episodes are also vertigo, although her symptoms are more lightheadedness than dizziness, and her workup so far was reportedly negative for vertigo.  She does not have any exertional lightheadedness.  She denies any palpitations and racing/fluttering heart.  She denies chest pain, worsening of her baseline shortness of breath, orthopnea, PND, and lower extremity edema.  Overall, she is feeling well, and her symptoms of lightheadedness have been slowly improving over the past couple of weeks.    ROS   A 10- point review of systems was conducted with the patient.  Pertinent positives as above.  All other systems reported as negative.    Medications     Current Outpatient Prescriptions   Medication Sig    ipratropium-albuterol (DUONEB) 0.5-2.5mg  /62mL nebulizer solution Take by nebulization 4 times daily as needed    MAGNESIUM PO Take 1 tablet by mouth every morning    citalopram (CELEXA) 40 MG tablet Take 40 mg by mouth nightly       esomeprazole (NEXIUM) 40 MG capsule Take 40 mg by mouth daily (at noon)       levothyroxine (SYNTHROID, LEVOTHROID) 75 MCG tablet Take 75 mcg by mouth daily (before breakfast)    rizatriptan (MAXALT) 10 MG tablet Take 10 mg by mouth as needed for Migraine   May repeat in 2 hours if needed.  Max  3 tabs/day.    allopurinol (ZYLOPRIM) 100 MG tablet Take 100 mg by mouth every morning       zolpidem (AMBIEN) 10 MG tablet Take 10 mg by mouth nightly       aspirin 81 MG tablet Take 81 mg by mouth every morning       Multiple Vitamins-Minerals (MULTIVITAMIN ADULT PO) Take 1 tablet by mouth every morning       Cyanocobalamin (VITAMIN B 12 PO) Take 1 tablet by mouth every morning       NONFORMULARY, OTHER, ORDER *I* Apply topically as needed    ALPRAZolam (XANAX) 0.25 MG tablet Take 0.25 mg by mouth 3 times daily as needed for Anxiety     Vitals and Physical Exam     Brandi Warner's  height is 1.651 m (5\' 5" ) and weight is 94.3 kg (208 lb). Her blood pressure is 130/82 and her pulse is 84.  Body mass index is 34.61 kg/m.    Physical Exam   Neck: Soft and supple, carotids 2+ bilaterally with no bruits.  No JVD noted.    Lungs: Clear to auscultation anterior and posterior.  Cardiovascular: Heart sounds clear and regular with normal S1-S2, with faint systolic murmur, no gallop, or rub.  Abdomen: Soft, no distension.  Extremities: Distal pulses intact, no edema.    Laboratory Data     No recent lab data- she  has labs ordered to be done through her primary office now.    Cardiac/Imaging Data & Risk Scores          Echo Complete 03/17/2018    Narrative  Normal left ventricular cavity size with normal systolic function.   Estimated LVEF 65%.   Mild to moderate aortic valve regurgitation.   Normal right ventricular size and systolic function.   Compared to prior study from 09/23/2016, there has been no significant   interval change.           Impression and Plan     1. Lightheadedness: Brandi Warner has had lightheadedness with lying down and turning her head for about 2 months.  The symptoms seem to be slowly improving.  The symptoms are similar to her prior vertigo, only described as lightheadedness instead of dizziness.  She has had a negative vertigo workup, but is still convinced this is related to fluid in her ears/vertigo.   We have ruled out many cardiac causes today.  Her echocardiogram shows no concern to explain the symptoms, her blood pressure has been well controlled, and her symptoms are not orthostatic.  She denies palpitations or racing/fluttering heart.  I did offer her a Holter monitor to rule out arrhythmia, but it would be very atypical of any arrhythmia to cause symptoms only when lying down or turning her head.  She declines Holter placement, especially since symptoms are improving.  She will continue to monitor, and will contact the office if symptoms worsen.    2. Aortic valve regurgitation: Compared to her last echocardiogram 09/2016, her valve does not appear to have progressed.  We will repeat another yearly echocardiogram, and if results stay stable, may consider increasing the duration to every 2 years.      No change to current medical therapy.  As above, we will follow up in one year with an echocardiogram.    Sincerely,    Doree Albee, M.S., AGACNP-BC  [facility]  Patient of Diana Eves, M.D., [facility]

## 2018-03-22 ENCOUNTER — Other Ambulatory Visit: Payer: Self-pay | Admitting: Gastroenterology

## 2018-03-25 ENCOUNTER — Other Ambulatory Visit
Admission: RE | Admit: 2018-03-25 | Discharge: 2018-03-25 | Disposition: A | Discharge status: Home / Self Care | Payer: No Typology Code available for payment source | Source: Ambulatory Visit | Attending: Family Medicine | Admitting: Family Medicine

## 2018-03-25 DIAGNOSIS — E039 Hypothyroidism, unspecified: Secondary | ICD-10-CM | POA: Insufficient documentation

## 2018-03-25 DIAGNOSIS — R7301 Impaired fasting glucose: Secondary | ICD-10-CM | POA: Insufficient documentation

## 2018-03-25 DIAGNOSIS — E7800 Pure hypercholesterolemia, unspecified: Secondary | ICD-10-CM | POA: Insufficient documentation

## 2018-03-25 LAB — COMPREHENSIVE METABOLIC PANEL
ALT: 27 U/L (ref 0–35)
AST: 26 U/L (ref 0–35)
Albumin: 3.8 g/dL (ref 3.5–5.2)
Alk Phos: 119 U/L — ABNORMAL HIGH (ref 35–105)
Anion Gap: 13 (ref 7–16)
Bilirubin,Total: 0.3 mg/dL (ref 0.0–1.2)
CO2: 25 mmol/L (ref 20–28)
Calcium: 9.6 mg/dL (ref 8.6–10.2)
Chloride: 104 mmol/L (ref 96–108)
Creatinine: 0.85 mg/dL (ref 0.51–0.95)
GFR,Black: 86 *
GFR,Caucasian: 74 *
Glucose: 115 mg/dL — ABNORMAL HIGH (ref 60–99)
Lab: 11 mg/dL (ref 6–20)
Potassium: 3.5 mmol/L (ref 3.4–4.7)
Sodium: 142 mmol/L (ref 133–145)
Total Protein: 6.7 g/dL (ref 6.3–7.7)

## 2018-03-25 LAB — LIPID PANEL
Chol/HDL Ratio: 3.4
Cholesterol: 180 mg/dL
HDL: 53 mg/dL
LDL Calculated: 94 mg/dL
Non HDL Cholesterol: 127 mg/dL
Triglycerides: 165 mg/dL — AB

## 2018-03-25 LAB — TSH: TSH: 0.29 u[IU]/mL (ref 0.27–4.20)

## 2018-03-25 LAB — HEMOGLOBIN A1C: Hemoglobin A1C: 5.8 % — ABNORMAL HIGH

## 2018-03-31 ENCOUNTER — Other Ambulatory Visit
Admission: RE | Admit: 2018-03-31 | Discharge: 2018-03-31 | Disposition: A | Discharge status: Home / Self Care | Payer: No Typology Code available for payment source | Source: Ambulatory Visit | Attending: Family Medicine | Admitting: Family Medicine

## 2018-03-31 DIAGNOSIS — F4310 Post-traumatic stress disorder, unspecified: Secondary | ICD-10-CM | POA: Insufficient documentation

## 2018-04-04 LAB — DRUG SCREEN PANEL, EMERGENCY
Amphetamine,UR: NEGATIVE
Barbiturate,UR: NEGATIVE
Benzodiazepinen,UR: NEGATIVE
Cocaine/Metab,UR: NEGATIVE
Opiates,UR: NEGATIVE
Propoxyphene,UR: NEGATIVE
THC Metabolite,UR: NEGATIVE

## 2018-08-11 ENCOUNTER — Ambulatory Visit: Payer: No Typology Code available for payment source

## 2018-10-05 ENCOUNTER — Other Ambulatory Visit
Admission: RE | Admit: 2018-10-05 | Discharge: 2018-10-05 | Disposition: A | Discharge status: Home / Self Care | Payer: No Typology Code available for payment source | Source: Ambulatory Visit | Attending: Family Medicine | Admitting: Family Medicine

## 2018-10-05 DIAGNOSIS — M25541 Pain in joints of right hand: Secondary | ICD-10-CM | POA: Insufficient documentation

## 2018-10-05 DIAGNOSIS — R21 Rash and other nonspecific skin eruption: Secondary | ICD-10-CM | POA: Insufficient documentation

## 2018-10-05 LAB — RHEUMATOID FACTOR,QUANT FFT ONLY: Rheumatoid Factor,Quant FFT Only: <10 IU/mL (ref 0–14)

## 2018-10-05 LAB — SEDIMENTATION RATE, AUTOMATED: Sedimentation Rate: 13 mm/hr (ref 0–30)

## 2018-10-05 LAB — URIC ACID: Urate: 4.9 mg/dL (ref 2.7–6.8)

## 2018-10-06 LAB — HSV TYPE 2 DNA PCR: HSV type 2 DNA PCR: 0

## 2018-10-06 LAB — HSV TYPE 1 DNA PCR: HSV type 1 DNA PCR: 0

## 2019-01-05 ENCOUNTER — Other Ambulatory Visit
Admission: RE | Admit: 2019-01-05 | Discharge: 2019-01-05 | Disposition: A | Discharge status: Home / Self Care | Payer: No Typology Code available for payment source | Source: Ambulatory Visit | Attending: Family Medicine | Admitting: Family Medicine

## 2019-01-05 DIAGNOSIS — E7800 Pure hypercholesterolemia, unspecified: Secondary | ICD-10-CM | POA: Insufficient documentation

## 2019-01-05 DIAGNOSIS — J449 Chronic obstructive pulmonary disease, unspecified: Secondary | ICD-10-CM | POA: Insufficient documentation

## 2019-01-05 DIAGNOSIS — E039 Hypothyroidism, unspecified: Secondary | ICD-10-CM | POA: Insufficient documentation

## 2019-01-05 DIAGNOSIS — R7301 Impaired fasting glucose: Secondary | ICD-10-CM | POA: Insufficient documentation

## 2019-01-05 LAB — TSH: TSH: 0.46 u[IU]/mL (ref 0.27–4.20)

## 2019-01-05 LAB — COMPREHENSIVE METABOLIC PANEL
ALT: 23 U/L (ref 0–35)
AST: 22 U/L (ref 0–35)
Albumin: 3.9 g/dL (ref 3.5–5.2)
Alk Phos: 135 U/L — ABNORMAL HIGH (ref 35–105)
Anion Gap: 13 (ref 7–16)
Bilirubin,Total: <0.2 mg/dL (ref 0.0–1.2)
CO2: 23 mmol/L (ref 20–28)
Calcium: 9.4 mg/dL (ref 8.6–10.2)
Chloride: 106 mmol/L (ref 96–108)
Creatinine: 0.91 mg/dL (ref 0.51–0.95)
GFR,Black: 79 *
GFR,Caucasian: 68 *
Glucose: 118 mg/dL — ABNORMAL HIGH (ref 60–99)
Lab: 14 mg/dL (ref 6–20)
Potassium: 3.6 mmol/L (ref 3.4–4.7)
Sodium: 142 mmol/L (ref 133–145)
Total Protein: 6.9 g/dL (ref 6.3–7.7)

## 2019-01-05 LAB — LIPID PANEL
Chol/HDL Ratio: 2.8
Cholesterol: 177 mg/dL
HDL: 64 mg/dL — ABNORMAL HIGH (ref 40–60)
LDL Calculated: 85 mg/dL
Non HDL Cholesterol: 113 mg/dL
Triglycerides: 141 mg/dL

## 2019-01-05 LAB — CBC
Hematocrit: 46 % — ABNORMAL HIGH (ref 34–45)
Hemoglobin: 15.4 g/dL (ref 11.2–15.7)
MCH: 33 pg/cell — ABNORMAL HIGH (ref 26–32)
MCHC: 34 g/dL (ref 32–36)
MCV: 97 fL — ABNORMAL HIGH (ref 79–95)
Platelets: 257 10*3/uL (ref 160–370)
RBC: 4.7 MIL/uL (ref 3.9–5.2)
RDW: 13.6 % (ref 11.7–14.4)
WBC: 8.9 10*3/uL (ref 4.0–10.0)

## 2019-01-07 LAB — HEMOGLOBIN A1C: Hemoglobin A1C: 5.9 % — ABNORMAL HIGH

## 2019-04-09 ENCOUNTER — Other Ambulatory Visit: Payer: Self-pay | Admitting: Family Medicine

## 2019-04-09 ENCOUNTER — Other Ambulatory Visit
Admission: RE | Admit: 2019-04-09 | Discharge: 2019-04-09 | Disposition: A | Discharge status: Home / Self Care | Payer: No Typology Code available for payment source | Source: Ambulatory Visit | Attending: Family Medicine | Admitting: Family Medicine

## 2019-04-09 DIAGNOSIS — Z1231 Encounter for screening mammogram for malignant neoplasm of breast: Secondary | ICD-10-CM

## 2019-04-09 DIAGNOSIS — F4310 Post-traumatic stress disorder, unspecified: Secondary | ICD-10-CM | POA: Insufficient documentation

## 2019-04-09 LAB — DRUG SCREEN CHEMICAL DEPENDENCY, URINE
Amphetamine,UR: NEGATIVE
Benzodiazepinen,UR: POSITIVE
Cocaine/Metab,UR: NEGATIVE
Opiates,UR: NEGATIVE
THC Metabolite,UR: NEGATIVE

## 2019-04-10 ENCOUNTER — Other Ambulatory Visit: Payer: Self-pay | Admitting: Family Medicine

## 2019-04-10 DIAGNOSIS — F17290 Nicotine dependence, other tobacco product, uncomplicated: Secondary | ICD-10-CM

## 2019-04-16 ENCOUNTER — Other Ambulatory Visit: Payer: Self-pay | Admitting: Cardiology

## 2019-04-16 DIAGNOSIS — I351 Nonrheumatic aortic (valve) insufficiency: Secondary | ICD-10-CM

## 2019-04-19 ENCOUNTER — Ambulatory Visit
Admission: RE | Admit: 2019-04-19 | Discharge: 2019-04-19 | Disposition: A | Discharge status: Home / Self Care | Payer: No Typology Code available for payment source | Source: Ambulatory Visit | Attending: Family Medicine | Admitting: Family Medicine

## 2019-04-19 DIAGNOSIS — F17290 Nicotine dependence, other tobacco product, uncomplicated: Secondary | ICD-10-CM

## 2019-04-19 DIAGNOSIS — Z87891 Personal history of nicotine dependence: Secondary | ICD-10-CM | POA: Insufficient documentation

## 2019-04-19 DIAGNOSIS — Z122 Encounter for screening for malignant neoplasm of respiratory organs: Secondary | ICD-10-CM | POA: Insufficient documentation

## 2019-04-23 ENCOUNTER — Ambulatory Visit: Payer: No Typology Code available for payment source | Admitting: Cardiology

## 2019-04-23 ENCOUNTER — Encounter: Payer: Self-pay | Admitting: Cardiology

## 2019-04-23 ENCOUNTER — Ambulatory Visit
Admission: RE | Admit: 2019-04-23 | Discharge: 2019-04-23 | Disposition: A | Discharge status: Home / Self Care | Payer: No Typology Code available for payment source | Source: Ambulatory Visit

## 2019-04-23 VITALS — BP 128/78 | HR 78 | Ht 65.0 in | Wt 235.0 lb

## 2019-04-23 DIAGNOSIS — I351 Nonrheumatic aortic (valve) insufficiency: Secondary | ICD-10-CM

## 2019-04-23 LAB — ECHO COMPLETE
AR CWD Gradient (peak): 68.1 mmHg
AR Velocity (peak): 412.7 cm/s
Aortic Arch Diameter: 2.9 cm
Aortic Diameter (mid tubular): 3 cm
Aortic Diameter (sinus of Valsalva): 3 cm
BMI: 39.2 kg/m2
BP Diastolic: 78 mmHg
BP Systolic: 128 mmHg
BSA: 2.21 m2
Deceleration Time - AR: 2226 ms
Deceleration Time - MV: 275 ms
Descending Aortic Diameter: 2.17 cm
E/A ratio: 0.49
Heart Rate: 78 {beats}/min
Height: 65 in
LA Diameter BSA Index: 1.5 cm/m2
LA Diameter Height Index: 2 cm/m
LA Diameter: 3.3 cm
LV ASE Mass BSA Index: 73 gm/m2
LV ASE Mass Height 2.7 Index: 41.7 gm/m2.7
LV ASE Mass Height Index: 97.7 gm/m
LV ASE Mass: 161.4 gm
LV CO BSA Index: 1.3 L/min/m2
LV Cardiac Output: 2.86 L/min
LV Diastolic Volume Index: 24.4 mL/m2
LV Posterior Wall Thickness: 1.2 cm
LV SV BSA Index: 16.6 mL/m2
LV SV Height Index: 22.2 mL/m
LV Septal Thickness: 1.1 cm
LV Stroke Volume: 36.7 mL
LV Systolic Volume Index: 7.8 mL/m2
LVED Diameter BSA Index: 1.9 cm/m2
LVED Diameter Height Index: 2.5 cm/m
LVED Diameter: 4.1 cm
LVED Volume BSA Index: 24 ml/m2
LVED Volume BSA Index: 24.4 mL/m2
LVED Volume Height Index: 32.7 mL/m
LVED Volume: 54 mL
LVEF (Volume): 68 %
LVES Diameter BSA Index: 1.1 cm/m2
LVES Diameter Height Index: 1.5 cm/m
LVES Diameter: 2.5 cm
LVES Volume BSA Index: 7.8 mL/m2
LVES Volume BSA Index: 8 ml/m2
LVES Volume Height Index: 10.5 mL/m
LVES Volume: 17.3 mL
MV Peak A Velocity: 90.4 cm/s
MV Peak E Velocity: 44.2 cm/s
Pressure Half-Time - AR: 645.6 ms
RR Interval: 769.23 ms
RVED Diameter BSA Index: 1 cm/m2
RVED Diameter Height Index: 1.4 cm/m
RVED Diameter: 2.29 cm
Weight (lbs): 235 [lb_av]
Weight: 3760 oz

## 2019-04-23 NOTE — Progress Notes (Signed)
CARDIOLOGY FOLLOW UP LETTER    Dear Brandi Warner,    Today, 04/23/2019, I had the pleasure of seeing Brandi Warner in followup for aortic valve regurgitation.    Since our last visit the patient denies any significant chest pain, progressive shortness of breath, orthopnea, PND, palpitations, presyncope or claudication.    Allergies, PMFS, & a 10+ ROS were reviewed and updated. Pertinent details as above.     MEDICATIONS:   Current Outpatient Medications   Medication Sig    ARIPiprazole (ABILIFY) 2 MG tablet Take 2 mg by mouth daily     citalopram (CELEXA) 40 MG tablet Take 40 mg by mouth nightly       esomeprazole (NEXIUM) 40 MG capsule Take 40 mg by mouth daily (at noon)       levothyroxine (SYNTHROID, LEVOTHROID) 75 MCG tablet Take 75 mcg by mouth daily (before breakfast)    rizatriptan (MAXALT) 10 MG tablet Take 10 mg by mouth as needed for Migraine   May repeat in 2 hours if needed.  Max 3 tabs/day.    allopurinol (ZYLOPRIM) 100 MG tablet Take 100 mg by mouth every morning       zolpidem (AMBIEN) 10 MG tablet Take 10 mg by mouth nightly       aspirin 81 MG tablet Take 81 mg by mouth every morning       Cyanocobalamin (VITAMIN B 12 PO) Take 1 tablet by mouth every morning       ipratropium-albuterol (DUONEB) 0.5-2.5mg  /39mL nebulizer solution Take by nebulization 4 times daily as needed    ALPRAZolam (XANAX) 0.25 MG tablet Take 0.25 mg by mouth 3 times daily as needed for Anxiety     No current facility-administered medications for this visit.        PHYSICAL EXAM:  Blood pressure 128/78, pulse 78, height 1.651 m (5\' 5" ), weight 106.6 kg (235 lb)., Body mass index is 39.11 kg/m.Marland Kitchen  General: Appears obese, alert, comfortable and in no acute distress.  Psychiatric: Normal affect.  Cardiac: Regular rate and rhythm with no murmurs rubs or gallops.  S1 and S2 are normal.  JVP is normal.   Pulmonary: Lungs are clear to auscultation anteriorly.  Peripheral:  There are no carotid bruits. No edema.  Skin:  Clean,  dry.    DATA/LABS:       Lab results: 01/05/19  1654   Sodium 142   Potassium 3.6   Chloride 106   CO2 23   UN 14   Creatinine 0.91   GFR,Caucasian 68   GFR,Black 79   Glucose 118*   Calcium 9.4           Lab results: 01/05/19  1654   WBC 8.9   Hemoglobin 15.4   Hematocrit 46*   RBC 4.7   Platelets 257           Lab results: 01/05/19  1654   Cholesterol 177   HDL 64*   LDL Calculated 85   Triglycerides 141         Lab results: 01/05/19  1654   Hemoglobin A1C 5.9*              Echo Complete 04/23/2019    Narrative  Normal left ventricular size and function   Mild aortic valve regurgitation   Normal estimated PA pressures and right heart function             IMPRESSION/PLAN:   1.  Aortic valve regurgitation.  This is mild  and stable.  It is unlikely to be a problem going forward.  I will update an echocardiogram again in 2 years but no further testing is needed at this time.    2.  Obesity.  I have encouraged a healthy diet and exercise program for weight control and cardiovascular health.    3.  I will see her back in 2 years time

## 2019-05-11 ENCOUNTER — Other Ambulatory Visit
Admission: RE | Admit: 2019-05-11 | Discharge: 2019-05-11 | Disposition: A | Discharge status: Home / Self Care | Payer: No Typology Code available for payment source | Source: Ambulatory Visit | Attending: Family Medicine | Admitting: Family Medicine

## 2019-05-11 DIAGNOSIS — M109 Gout, unspecified: Secondary | ICD-10-CM | POA: Insufficient documentation

## 2019-05-11 LAB — CBC AND DIFFERENTIAL
Baso # K/uL: 0.1 10*3/uL (ref 0.0–0.1)
Basophil %: 0.9 %
Eos # K/uL: 0.3 10*3/uL (ref 0.0–0.4)
Eosinophil %: 3.3 %
Hematocrit: 47 % — ABNORMAL HIGH (ref 34–45)
Hemoglobin: 15.7 g/dL (ref 11.2–15.7)
IMM Granulocytes #: 0 10*3/uL (ref 0.0–0.0)
IMM Granulocytes: 0.3 %
Lymph # K/uL: 2.8 10*3/uL (ref 1.2–3.7)
Lymphocyte %: 35.7 %
MCH: 33 pg/cell — ABNORMAL HIGH (ref 26–32)
MCHC: 33 g/dL (ref 32–36)
MCV: 98 fL — ABNORMAL HIGH (ref 79–95)
Mono # K/uL: 0.5 10*3/uL (ref 0.2–0.9)
Monocyte %: 6.4 %
Neut # K/uL: 4.2 10*3/uL (ref 1.6–6.1)
Nucl RBC # K/uL: 0 10*3/uL (ref 0.0–0.0)
Nucl RBC %: 0 /100 WBC (ref 0.0–0.2)
Platelets: 269 10*3/uL (ref 160–370)
RBC: 4.8 MIL/uL (ref 3.9–5.2)
RDW: 14.3 % (ref 11.7–14.4)
Seg Neut %: 53.4 %
WBC: 7.8 10*3/uL (ref 4.0–10.0)

## 2019-05-11 LAB — SEDIMENTATION RATE, AUTOMATED: Sedimentation Rate: 16 mm/hr (ref 0–30)

## 2019-05-11 LAB — URIC ACID: Urate: 4.8 mg/dL (ref 2.7–6.8)

## 2019-05-14 ENCOUNTER — Ambulatory Visit: Payer: No Typology Code available for payment source | Attending: Gastroenterology | Admitting: Gastroenterology

## 2019-05-14 ENCOUNTER — Encounter: Payer: Self-pay | Admitting: Gastroenterology

## 2019-05-14 VITALS — BP 136/79 | HR 83 | Temp 97.3°F | Ht 65.0 in | Wt 236.0 lb

## 2019-05-14 DIAGNOSIS — K219 Gastro-esophageal reflux disease without esophagitis: Secondary | ICD-10-CM | POA: Insufficient documentation

## 2019-05-14 NOTE — Progress Notes (Signed)
GASTROENTEROLOGY HISTORY AND PHYSICAL   Brandi Warner   63 y.o.   female   05/14/2019 2:20 PM     Chief Complaint:   H/o colonic polyps and GERD/Barrett's esophagus    HPI: Patient is a 63 year old Caucasian female the history of recurrent colonic polyps have been multiple necessitating several colonoscopies over the past in which multiple polyps were removed.  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 She has  been referred to the colorectal surgeons or follow-up colonoscopy to schedule on October 5 multiplicity of polyps removed tubulovillous adenoma removed from the rectum.    Past Medical and Surgical History:   Past Medical History:   Diagnosis Date    Anxiety     Aortic valve disease     Arthritis     Barrett's esophagus     Colon polyp     COPD (chronic obstructive pulmonary disease)     Depression     GERD (gastroesophageal reflux disease)     Gout     Hiatal hernia     Hypothyroidism 08/05/2010    Migraine     once a week    Tobacco abuse       Past Surgical History:   Procedure Laterality Date    CHOLECYSTECTOMY  2005    po N/V kept overnight    nasal endoscopic polypectomy  1992    OVARY REMOVAL Left 1976    with cyst    PR RECTAL TUMOR EXCISION, TRANSANAL ENDOSCOPIC MICROSURGICAL, FULL THICK N/A 06/09/2017    Procedure: TRANSANAL ENDOSCOPIC MICROSURGERY (TEMS) ;  Surgeon: Doren Custard, MD;  Location: [facility] MAIN OR;  Service: Colorectal    PR SIGMOIDOSCOPY FLX DX W/COLLJ SPEC BR/WA IF PFRMD N/A 05/17/2017    FLEXIBLE SIGMOIDOSCOPY;  Surgeon: Doren Custard, MD;  Location: [facility] OR;  Service: Colorectal, no anesthesia issues         Allergies: No known drug allergy     Current Outpatient Medications   Medication    Multiple Vitamin (MULTI VITAMIN DAILY PO)    ARIPiprazole (ABILIFY) 2 MG tablet    citalopram (CELEXA) 40 MG tablet    ALPRAZolam  (XANAX) 0.25 MG tablet    esomeprazole (NEXIUM) 40 MG capsule    levothyroxine (SYNTHROID, LEVOTHROID) 75 MCG tablet    allopurinol (ZYLOPRIM) 100 MG tablet    zolpidem (AMBIEN) 10 MG tablet    aspirin 81 MG tablet    Cyanocobalamin (VITAMIN B 12 PO)    ipratropium-albuterol (DUONEB) 0.5-2.81m /326mnebulizer solution    rizatriptan (MAXALT) 10 MG tablet     No current facility-administered medications for this visit.       Prior to Admission medications    Medication Sig Start Date End Date Taking? Authorizing Provider   Multiple Vitamin (MULTI VITAMIN DAILY PO) Take by mouth   Yes [provider]   ARIPiprazole (ABILIFY) 2 MG tablet Take 2 mg by mouth daily  01/26/19  Yes [provider]   citalopram (CELEXA) 40 MG tablet Take 40 mg by mouth nightly      Yes [provider]   ALPRAZolam (XANAX) 0.25 MG tablet Take 0.25 mg by mouth 3 times daily as needed for Anxiety   Yes [provider]   esomeprazole (NEXIUM) 40 MG capsule Take 40 mg by mouth daily (at noon)      Yes  [provider]   levothyroxine (SYNTHROID, LEVOTHROID) 75 MCG tablet Take 75 mcg by mouth daily (before breakfast)   Yes [provider]   allopurinol (ZYLOPRIM) 100 MG tablet Take 100 mg by mouth every morning      Yes [provider]   zolpidem (AMBIEN) 10 MG tablet Take 10 mg by mouth nightly      Yes [provider]   aspirin 81 MG tablet Take 81 mg by mouth every morning      Yes [provider]   Cyanocobalamin (VITAMIN B 12 PO) Take 1 tablet by mouth every morning      Yes [provider]   ipratropium-albuterol (DUONEB) 0.5-2.11m /363mnebulizer solution Take by nebulization 4 times daily as needed 02/20/18   [provider]   rizatriptan (MAXALT) 10 MG tablet Take 10 mg by mouth as needed for Migraine   May repeat in 2 hours if needed.  Max 3 tabs/day.    [provider]       Family Medical History:   Family History   Problem  Relation Age of Onset    Pacemaker Mother     Heart surgery Mother [id number]      3 bypass    COPD Brother     Anesthesia problems Neg Hx         Social History:   Use of Tobacco:   Social History     Tobacco Use   Smoking Status Current Every Day Smoker    Packs/day: 1.00    Years: 39.00    Pack years: 39.00    Types: Cigarettes    Start date: 09/10/1977   Smokeless Tobacco Never Used      Use of Alcohol:   Social History     Substance and Sexual Activity   Alcohol Use No        Physical Exam:   General appearance: No acute distress  Vital signs:  BP 136/79    Pulse 83    Temp 36.3 C (97.3 F)    Ht 165.1 cm (_0 )    Wt 107 kg (236 lb)    SpO2 94%    BMI 39.27 kg/m  BP 136/79    Pulse 83    Temp 36.3 C (97.3 F)    Ht 165.1 cm (_1 )    Wt 107 kg (236 lb)    SpO2 94%    BMI 39.27 kg/m    HEENT: Anicteric Conjunctivae  Neck:Supple  Cardiac: RRR, nml S1 and S2, no thrills ,gallops, rubs and murmurs  Lungs:Clear to auscultation bilaterally  Abdomen: nondistended, positive bowel sounds,nontender and no hepatosplenomegaly    ROS: Cardiac:nonfocal, Neuro:nonfocal, Pulmonary:nonfocal    Laboratory:         CBC:   Lab Results   Component Value Date    WBC 7.8 05/11/2019    RBC 4.8 05/11/2019    HGB 15.7 05/11/2019    HCT 47 (H) 05/11/2019    PLT 269 05/11/2019      BMG:   Lab Results   Component Value Date    GLU 118 (H) 01/05/2019    NA 142 01/05/2019    K 3.6 01/05/2019    CL 106 01/05/2019    CO2 23 01/05/2019    CREAT 0.91 01/05/2019    CA 9.4 01/05/2019      COAGS:   Lab Results   Component Value Date    INR 1.0 05/31/2017  LFTs:   Lab Results   Component Value Date    ALB 3.9 01/05/2019    ALK 135 (H) 01/05/2019    AST 22 01/05/2019    ALT 23 01/05/2019        Imaging:      Assessment / Plan: The patient has been seen and examined and has a history of multiple colonic polyps and is scheduled for colonoscopy by the colorectal surgeons on October 5 for follow-up.  She last underwent an endoscopy in March  2018 and is due for another endoscopy for surveillance of Barrett's esophagus which I have recommended that she schedule an appointment for an endoscopy.  She has been counseled regarding antireflux measures as well as to continue proton pump therapy.  Approximately 30 minutes were spent for this visit.              Thank you very much for allowing me to participate in the care of your patient.   Sincerely,  Phillips Odor, MD    Brandi Buckhalter Arma Heading, MD

## 2019-05-23 ENCOUNTER — Other Ambulatory Visit: Payer: Self-pay | Admitting: Gastroenterology

## 2019-05-23 ENCOUNTER — Other Ambulatory Visit: Payer: Self-pay | Admitting: Nurse Practitioner

## 2019-05-23 DIAGNOSIS — R1011 Right upper quadrant pain: Secondary | ICD-10-CM

## 2019-05-29 ENCOUNTER — Other Ambulatory Visit: Payer: Self-pay | Admitting: Gastroenterology

## 2019-05-30 ENCOUNTER — Other Ambulatory Visit
Admission: RE | Admit: 2019-05-30 | Discharge: 2019-05-30 | Disposition: A | Discharge status: Home / Self Care | Payer: No Typology Code available for payment source | Source: Ambulatory Visit | Attending: Nurse Practitioner | Admitting: Nurse Practitioner

## 2019-05-30 DIAGNOSIS — R1011 Right upper quadrant pain: Secondary | ICD-10-CM | POA: Insufficient documentation

## 2019-05-30 LAB — CREATININE, SERUM
Creatinine: 0.92 mg/dL (ref 0.51–0.95)
GFR,Black: 77 *
GFR,Caucasian: 67 *

## 2019-06-08 ENCOUNTER — Ambulatory Visit
Admission: RE | Admit: 2019-06-08 | Discharge: 2019-06-08 | Disposition: A | Discharge status: Home / Self Care | Payer: No Typology Code available for payment source | Source: Ambulatory Visit | Attending: Nurse Practitioner | Admitting: Nurse Practitioner

## 2019-06-08 DIAGNOSIS — K449 Diaphragmatic hernia without obstruction or gangrene: Secondary | ICD-10-CM | POA: Insufficient documentation

## 2019-06-08 DIAGNOSIS — R1011 Right upper quadrant pain: Secondary | ICD-10-CM

## 2019-06-08 MED ORDER — IOHEXOL 350 MG/ML (OMNIPAQUE) IV SOLN 500ML BOTTLE *I*
1.0000 mL | Freq: Once | INTRAVENOUS | Status: AC
Start: 2019-06-08 — End: 2019-06-08
  Administered 2019-06-08: 130 mL via INTRAVENOUS

## 2019-06-08 MED ORDER — STERILE WATER FOR IRRIGATION IR SOLN *I*
900.0000 mL | Freq: Once | Status: AC
Start: 2019-06-08 — End: 2019-06-08
  Administered 2019-06-08: 900 mL via ORAL

## 2019-06-14 ENCOUNTER — Other Ambulatory Visit: Payer: No Typology Code available for payment source

## 2019-06-14 ENCOUNTER — Ambulatory Visit: Payer: No Typology Code available for payment source

## 2019-06-15 ENCOUNTER — Other Ambulatory Visit
Admission: RE | Admit: 2019-06-15 | Discharge: 2019-06-15 | Disposition: A | Discharge status: Home / Self Care | Payer: No Typology Code available for payment source | Source: Ambulatory Visit | Attending: Family Medicine | Admitting: Family Medicine

## 2019-06-15 DIAGNOSIS — M13841 Other specified arthritis, right hand: Secondary | ICD-10-CM | POA: Insufficient documentation

## 2019-06-18 LAB — ANTINUCLEAR ANTIBODY SCREEN: ANA Screen: NEGATIVE

## 2019-06-18 LAB — LYME AB SCREEN: Lyme AB Screen: NEGATIVE

## 2019-06-22 ENCOUNTER — Telehealth: Payer: Self-pay

## 2019-06-22 NOTE — Telephone Encounter (Signed)
Called and spoke with patient to remind her she needs to have Covid 19 testing done on Monday 11/2 prior to her procedure.  She is aware and has arranged for the testing.  Covid order is in patient's chart.

## 2019-06-25 ENCOUNTER — Other Ambulatory Visit
Admission: RE | Admit: 2019-06-25 | Discharge: 2019-06-25 | Disposition: A | Discharge status: Home / Self Care | Payer: No Typology Code available for payment source | Source: Ambulatory Visit | Attending: Gastroenterology | Admitting: Gastroenterology

## 2019-06-25 DIAGNOSIS — Z20828 Contact with and (suspected) exposure to other viral communicable diseases: Secondary | ICD-10-CM | POA: Insufficient documentation

## 2019-06-27 LAB — COVID-19 NAAT (PCR): COVID-19 NAAT (PCR): NEGATIVE

## 2019-06-27 LAB — COVID-19 PCR

## 2019-06-28 ENCOUNTER — Ambulatory Visit
Admission: RE | Admit: 2019-06-28 | Discharge: 2019-06-28 | Disposition: A | Discharge status: Home / Self Care | Payer: No Typology Code available for payment source | Source: Ambulatory Visit | Attending: Gastroenterology | Admitting: Gastroenterology

## 2019-06-28 ENCOUNTER — Encounter: Payer: Self-pay | Admitting: Gastroenterology

## 2019-06-28 DIAGNOSIS — Z8719 Personal history of other diseases of the digestive system: Secondary | ICD-10-CM | POA: Insufficient documentation

## 2019-06-28 DIAGNOSIS — K449 Diaphragmatic hernia without obstruction or gangrene: Secondary | ICD-10-CM | POA: Insufficient documentation

## 2019-06-28 DIAGNOSIS — K2100 Gastro-esophageal reflux disease with esophagitis, without bleeding: Secondary | ICD-10-CM | POA: Insufficient documentation

## 2019-06-28 DIAGNOSIS — K228 Other specified diseases of esophagus: Secondary | ICD-10-CM | POA: Insufficient documentation

## 2019-06-28 DIAGNOSIS — K317 Polyp of stomach and duodenum: Secondary | ICD-10-CM | POA: Insufficient documentation

## 2019-06-28 MED ORDER — FENTANYL CITRATE 50 MCG/ML IJ SOLN *WRAPPED*
INTRAMUSCULAR | Status: AC | PRN
Start: 2019-06-28 — End: 2019-06-28
  Administered 2019-06-28: 50 ug via INTRAVENOUS

## 2019-06-28 MED ORDER — MIDAZOLAM HCL 5 MG/ML IJ SOLUTION *WRAPPED*
INTRAMUSCULAR | Status: AC
Start: 2019-06-28 — End: 2019-06-28
  Filled 2019-06-28: qty 2

## 2019-06-28 MED ORDER — FENTANYL CITRATE 50 MCG/ML IJ SOLN *WRAPPED*
INTRAMUSCULAR | Status: AC
Start: 2019-06-28 — End: 2019-06-28
  Filled 2019-06-28: qty 2

## 2019-06-28 MED ORDER — MIDAZOLAM HCL 5 MG/ML IJ SOLUTION *WRAPPED*
INTRAMUSCULAR | Status: AC | PRN
Start: 2019-06-28 — End: 2019-06-28
  Administered 2019-06-28: 1 mg via INTRAVENOUS
  Administered 2019-06-28: 3 mg via INTRAVENOUS

## 2019-06-28 NOTE — Discharge Instructions (Signed)
GI Discharge Instructions      Procedure: EGD    Activity:    -Due to the medication you received, do not work, drive a vehicle or operate any machinery today.  -Do not make any major decisions for the remainder of the day.   -Resume your regular activities tomorrow.    Diet:   -Resume your usual diet.    -Do not drink alcohol today, it might interact with the medication you received.    Medications:   -Resume medications as prescribed by your PCP.    Things to Expect:   -You might notice a small amount of blood in your stool after the procedure.    -You might have mild cramping or bloating.  These feelings should go away as you pass "gas".  Walking around will help alleviate this feeling.  If you develop severe abdominal pain, bleeding from the rectum, elevated temperature, call your endoscopist.  -You may have a slight sore throat.  If you develop any shortness of breath, chest pain or abdominal pain, call your endoscopist.    Special Instructions:   -If a polyp was removed, do not take any Aspirin, Advil, Motrin, Ibuprofen or any Nonsteroidal anti-inflammatory drugs for *** days.   -If the IV site becomes pink or tender, place a warm, wet compress on it for 10 minutes, 3 times a day.  If the site becomes red, hard or painful, call your endoscopist.    Follow-up Care:   -Your endoscopist's phone number is [phone]    -Call your endoscopist's office for biopsy results in 7-10 days.    -Call your endoscopist's office for a follow-up appointment in 2 weeks.   -A report of your procedure will be sent to your PCP.        JEFFREY A GOLDSTEIN, MD

## 2019-06-28 NOTE — Procedures (Signed)
Procedure: Esophagogastroduodenoscopy --diagnostic    Indications: GERD, Barrett's esophagus    Informed consent was obtained for the procedure, including sedation.  Risks of perforation, hemorrhage, infection, adverse drug reaction and aspiration were discussed. The patient was placed in position.  Based on the pre-procedure assessment, including review of the patient's medical history, medications, allergies, and review of systems, the patient had been deemed to be an appropriate candidate for conscious sedation; the patient was therefore sedated with the medications listed below.  The patient was monitored continuously with pulse oximetry, blood pressure monitoring, and direct observations.    Sedation: Versed 4 mg IV, fentanyl 50 mcg IV    Pre-Procedure History & Physical:    Patient's medications, allergies, past medical, surgical, social and family histories were reviewed and updated as appropriate.    Procedure Details:       The endoscope was introduced and passed without difficulty to second portion of the duodenum. A careful inspection was made as the endoscope was withdrawn.  The endoscope was passed beyond the upper esophageal sphincter into the esophagus.  The Esophageal mucosa at the distal aspect had two 1/4 to 1/2 cm tongues of salmon-pink mucosa biopsy taken x4 from return in all 4 quadrants every half centimeter for surveillance for Barrett's esophagus.  The gastric cardia, fundus, antrum and body had a sliding hiatal hernia approximately 3 cm upon retroflexion with a few small 2 mm fundic polyps biopsies were taken.  The duodenum had unremarkable mucosa to the second portion.    Findings and interventions are described below.      Findings: -r/o short segment Barrett's esophagus surveillance pattern biopsies taken, sliding hiatal hernia, r/o 18mm gastric fundic polyp     Therapeutic interventions: biopsy    Specimens: as above                Disposition: home       Condition: Stable    EBL:  None    Attending Attestation: I performed the procedure.      Assistants: None    Impression: -See post-procedure diagnoses.    Plan: -Continue acid suppression., -Acid suppression with a proton pump inhibitor., -Await pathology. and -Follow up with me.    Brandi Rane A Hamsa Laurich, MD  Procedure Report        Moderate Sedation Face Times  Start Time: [id number]  End Time: 1003  Duration (minutes): 8 Minutes

## 2019-06-28 NOTE — Preop H&P (Signed)
GASTROENTEROLOGY HISTORY AND PHYSICAL   Brandi Warner   63 y.o.   female   06/28/2019 9:47 AM   Chief Complaint and PNT:IRWE and Barrett's esophagus    Allergies: No known drug allergy   Current Outpatient Medications   Medication    Magnesium 100 MG CAPS    aspirin 81 MG tablet    Multiple Vitamin (MULTI VITAMIN DAILY PO)    ARIPiprazole (ABILIFY) 2 MG tablet    ipratropium-albuterol (DUONEB) 0.5-2.46m /373mnebulizer solution    citalopram (CELEXA) 40 MG tablet    ALPRAZolam (XANAX) 0.25 MG tablet    esomeprazole (NEXIUM) 40 MG capsule    levothyroxine (SYNTHROID, LEVOTHROID) 75 MCG tablet    rizatriptan (MAXALT) 10 MG tablet    allopurinol (ZYLOPRIM) 100 MG tablet    zolpidem (AMBIEN) 10 MG tablet    Cyanocobalamin (VITAMIN B 12 PO)     No current facility-administered medications for this encounter.       Prior to Admission medications    Medication Sig Start Date End Date Taking? Authorizing Provider   Magnesium 100 MG CAPS Take by mouth   Yes [provider]   aspirin 81 MG tablet Take 81 mg by mouth every morning      Yes [provider]   Multiple Vitamin (MULTI VITAMIN DAILY PO) Take by mouth    [provider]   ARIPiprazole (ABILIFY) 2 MG tablet Take 2 mg by mouth daily  01/26/19   [provider]   ipratropium-albuterol (DUONEB) 0.5-2.66m69m3mL29mbulizer solution Take by nebulization 4 times daily as needed 02/20/18   [provider]   citalopram (CELEXA) 40 MG tablet Take 40 mg by mouth nightly       [provider]   ALPRAZolam (XANAX) 0.25 MG tablet Take 0.25 mg by mouth 3 times daily as needed for Anxiety    [provider]   esomeprazole (NEXIUM) 40 MG capsule Take 40 mg by mouth daily (at noon)       [provider]   levothyroxine (SYNTHROID, LEVOTHROID) 75 MCG tablet Take 75 mcg by mouth daily (before breakfast)    [provider]   rizatriptan (MAXALT) 10 MG tablet Take 10 mg by mouth as needed for Migraine    May repeat in 2 hours if needed.  Max 3 tabs/day.    [provider]   allopurinol (ZYLOPRIM) 100 MG tablet Take 100 mg by mouth every morning       [provider]   zolpidem (AMBIEN) 10 MG tablet Take 10 mg by mouth nightly       [provider]   Cyanocobalamin (VITAMIN B 12 PO) Take 1 tablet by mouth every morning       [provider]     Family Medical History:   Family History   Problem Relation Age of Onset    Pacemaker Mother     Heart surgery Mother 78  66    3 bypass    COPD Brother     Anesthesia problems Neg Hx       Social History:   Use of Tobacco:   Social History     Tobacco Use   Smoking Status Current Every Day Smoker    Packs/day: 1.00    Years: 39.00    Pack years: 39.00    Types: Cigarettes    Start date: 09/10/1977   Smokeless Tobacco Never Used      Use  of Alcohol:   Social History     Substance and Sexual Activity   Alcohol Use No      Past Medical and Surgical History:   Past Medical History:   Diagnosis Date    Anxiety     Aortic valve disease     Arthritis     Barrett's esophagus     Colon polyp     COPD (chronic obstructive pulmonary disease)     Depression     GERD (gastroesophageal reflux disease)     Gout     Hiatal hernia     Hypothyroidism 08/05/2010    Migraine     once a week    Tobacco abuse       Past Surgical History:   Procedure Laterality Date    CHOLECYSTECTOMY  2005    po N/V kept overnight    nasal endoscopic polypectomy  1992    OVARY REMOVAL Left 1976    with cyst    PR RECTAL TUMOR EXCISION, TRANSANAL ENDOSCOPIC MICROSURGICAL, FULL THICK N/A 06/09/2017    Procedure: TRANSANAL ENDOSCOPIC MICROSURGERY (TEMS) ;  Surgeon: Doren Custard, MD;  Location: [facility] MAIN OR;  Service: Colorectal    PR SIGMOIDOSCOPY FLX DX W/COLLJ SPEC BR/WA IF PFRMD N/A 05/17/2017    FLEXIBLE SIGMOIDOSCOPY;  Surgeon: Doren Custard, MD;  Location: [facility] OR;  Service: Colorectal, no anesthesia issues                 CBC:   Lab  Results   Component Value Date    WBC 7.8 05/11/2019    RBC 4.8 05/11/2019    HGB 15.7 05/11/2019    HCT 47 (H) 05/11/2019    PLT 269 05/11/2019      BMG:   Lab Results   Component Value Date    GLU 118 (H) 01/05/2019    NA 142 01/05/2019    K 3.6 01/05/2019    CL 106 01/05/2019    CO2 23 01/05/2019    CREAT 0.92 05/30/2019    CA 9.4 01/05/2019      COAGS:   Lab Results   Component Value Date    INR 1.0 05/31/2017      LFTs:   Lab Results   Component Value Date    ALB 3.9 01/05/2019    ALK 135 (H) 01/05/2019    AST 22 01/05/2019    ALT 23 01/05/2019      Pre-Procedure Physical:   BP 157/84 (BP Location: Left arm)    Pulse 88    Temp 36.3 C (97.3 F) (Temporal)    Resp 16    Ht 165.1 cm (_0 )    Wt 106.6 kg (235 lb)    SpO2 95%    BMI 39.11 kg/m ;   Pre-Procedure Exam:   Airway- Normal   Neck- Without adenopathy   Lungs- Clear to auscultation.   Cardiac- Regular rhythm, S1 and S2   Abdomen- Non-tender, no organomegaly or mass   Extremities- No clubbing or cyanosis   Neuro- Grossly intact, oriented with appropriate mental status   ASA Class: ASA 2 - Patient with mild systemic disease with no functional limitations   Mallampati:II (hard and soft palate, upper portion of tonsils anduvula visible)  Informed Consent:   Informed consent was obtained for the procedure (see scanned consent form), including sedation. Risks of perforation, hemorrhage, adverse drug reaction and aspiration and potential problems related to recuperation were discussed. Based on the pre-procedure assessment, including review  of the Patient's medical history, medications, allergies, and review of systems, she had been deemed to be an appropriate candidate for moderate sedation or MAC, anesthesia as delineated below.     I have reviewed the procedure, alternatives including no surgery, potential risks and complications in great detail with the patient or their representative. I have answered all questions. Patient or their representative has  consented to this procedure.  Statement of Conclusion / Impression: The provisional diagnosis is as stated in the Chief Complaint / HPI. The plan is to proceed with the EGD procedure(s) and determine if there is a need for biopsy or therapeutic intervention.     Berlynn Warsame Arma Heading, MD

## 2019-06-28 NOTE — Progress Notes (Signed)
Discharge Instructions reviewed with patient.  The patient expressed understanding and is ready to be discharged.  Due to COVID-19 Infection Risk Reduction measures, patient and [facility] staff member are not using a pen to sign the Discharge Instructions.

## 2019-07-02 LAB — SURGICAL PATHOLOGY

## 2019-07-04 ENCOUNTER — Telehealth: Payer: Self-pay

## 2019-07-05 ENCOUNTER — Encounter: Payer: Self-pay | Admitting: Orthopedic Surgery

## 2019-07-05 ENCOUNTER — Ambulatory Visit
Admission: RE | Admit: 2019-07-05 | Discharge: 2019-07-05 | Disposition: A | Discharge status: Home / Self Care | Payer: No Typology Code available for payment source | Source: Ambulatory Visit | Attending: Orthopedic Surgery | Admitting: Orthopedic Surgery

## 2019-07-05 ENCOUNTER — Other Ambulatory Visit: Payer: Self-pay | Admitting: Orthopedic Surgery

## 2019-07-05 ENCOUNTER — Ambulatory Visit: Payer: No Typology Code available for payment source | Admitting: Orthopedic Surgery

## 2019-07-05 VITALS — Ht 65.0 in | Wt 235.0 lb

## 2019-07-05 DIAGNOSIS — R202 Paresthesia of skin: Secondary | ICD-10-CM | POA: Insufficient documentation

## 2019-07-05 DIAGNOSIS — M79641 Pain in right hand: Secondary | ICD-10-CM | POA: Insufficient documentation

## 2019-07-05 DIAGNOSIS — R200 Anesthesia of skin: Secondary | ICD-10-CM | POA: Insufficient documentation

## 2019-07-05 DIAGNOSIS — M7989 Other specified soft tissue disorders: Secondary | ICD-10-CM | POA: Insufficient documentation

## 2019-07-05 DIAGNOSIS — M85841 Other specified disorders of bone density and structure, right hand: Secondary | ICD-10-CM

## 2019-07-05 MED ORDER — DICLOFENAC SODIUM CR 100 MG PO TB24 *A*
100.0000 mg | ORAL_TABLET | Freq: Every day | ORAL | 1 refills | Status: AC
Start: 2019-07-05 — End: ?

## 2019-07-05 MED ORDER — METHYLPREDNISOLONE 4 MG PO TBPK *A*
ORAL_TABLET | ORAL | 0 refills | Status: DC
Start: 2019-07-05 — End: 2019-08-29

## 2019-07-05 NOTE — Progress Notes (Signed)
Hand and Upper Extremity Surgery    SUBJECTIVE     History of present Illness:  Brandi Warner is a 63 y.o. right hand-dominant female who presents for an initial evaluation of right wrist and hand pain.  The patient has had swelling with pain of her right wrist and hand along with numbness and tingling of the index, middle, ring fingers over the past 2 months.  The thumb and small finger are spared.  She did see her doctor initially.  She had a workup for rheumatoid arthritis and Lyme disease.  Lab work has all been negative.  She was referred here.  She has not had any treatment for this.  She denies trauma or injury.  She denies overuse.  The numbness along with the pain does wake her up at night.  She denies catching or locking of any of the digits.  The patient denies recent weight loss, night sweats or change in appetite.     Allergies:   Allergies   Allergen Reactions    No Known Drug Allergy      Created by Conversion - 0;        Past Medical/ Surgical History: Reviewed and included in the electronic medical records    Social History:  reports that she has been smoking cigarettes. She started smoking about 41 years ago. She has a 39.00 pack-year smoking history. She has never used smokeless tobacco. She reports that she does not drink alcohol or use drugs.    Family History: Reviewed and included in the electronic medical records    OBJECTIVE     Vitals: Ht 1.651 m (5\' 5" )    Wt 106.6 kg (235 lb)    BMI 39.11 kg/m   General: The patient is alert and oriented x3, is well dressed and well kept, pleasant and cooperative through the evaluation.  Constitutional: Afebrile with stable vital signs, hemodynamically stable, in no acute distress.    Upper Extremities:  Comprehensive exam of the right upper extremity: Hand and wrist  There is diffuse swelling of the hand that is mild to moderate.  No obvious swelling of the wrist.  The patient does seem to have tenderness over the A1 pulleys of the index, middle, ring  fingers.  She has mild limitation on full flexion of the digits secondary to the swelling.  No triggering is noted.  Positive Tinel's and Phalen's test of the wrist.  Subjective paresthesia of the index, middle, ring fingers.  Strength is slightly decreased secondary to pain.  Neurovascular status intact otherwise.        Imaging/ Studies:  X-rays revealed:  Mild generalized osteopenia is present. No acute fracture or dislocation or significant arthritic changes are seen. Negative ulnar variance is present.      ASSESSMENT     The patient presents today with signs and symptoms consistent with:   Diffuse right hand swelling/pain/numbness     PLAN     I had a long discussion with the patient regarding the pathophysiology of this problem and we agreed on the following plan:    I reviewed x-rays with the patient.  She does not have substantial arthritis.  She does have some osteopenia.  I reviewed lab work from her doctor.  I did see that her ANA and Lyme lab work was negative but I did not actually see a rheumatoid factor so I did order one.  The patient will to check with her doctor prior to getting the blood work  to see if possibly that was specifically ordered since she think she did have it ordered and it was negative.  Aside from this, she has diffuse swelling and this can be secondary to multiple trigger fingers since she did have some pain over the A1 pulleys of the index, middle, ring fingers.  She did not have any obvious triggering.  She also can have swelling secondary to possible carpal tunnel syndrome.  She will be sent for an EMG study for this.  She will be provided with the nocturnal brace.  We talked about treatment for this and I believe the patient needs to definitely go to physical therapy to work on range of motion.  She may need a compression glove.  She was placed on Medrol followed by Voltaren to help with the swelling.  She will be back after the EMG to discuss the results with Dr. Theodoro Doing.          The patient is in full understanding with the above and agrees with the plan. All questions were answered.    Zaire Vanbuskirk, PA as of 10:49 AM 07/05/2019      This note has been dictated using Dragon voice recognition software.

## 2019-07-09 ENCOUNTER — Other Ambulatory Visit: Payer: Self-pay | Admitting: Gastroenterology

## 2019-07-11 ENCOUNTER — Ambulatory Visit
Admission: RE | Admit: 2019-07-11 | Discharge: 2019-07-11 | Disposition: A | Discharge status: Home / Self Care | Payer: No Typology Code available for payment source | Source: Ambulatory Visit | Attending: Surgery | Admitting: Surgery

## 2019-07-11 ENCOUNTER — Ambulatory Visit
Admit: 2019-07-11 | Discharge: 2019-07-11 | Disposition: A | Discharge status: Home / Self Care | Payer: No Typology Code available for payment source | Attending: Surgery | Admitting: Surgery

## 2019-07-11 DIAGNOSIS — Z01818 Encounter for other preprocedural examination: Secondary | ICD-10-CM

## 2019-07-11 DIAGNOSIS — R1011 Right upper quadrant pain: Secondary | ICD-10-CM

## 2019-07-11 HISTORY — DX: Other specified postprocedural states: R11.2

## 2019-07-11 LAB — EKG 12-LEAD
P: 49 deg
PR: 154 ms
QRS: -69 deg
QRSD: 123 ms
QT: 421 ms
QTc: 458 ms
Rate: 71 {beats}/min
T: 229 deg

## 2019-07-11 LAB — COMPREHENSIVE METABOLIC PANEL
ALT: 19 U/L (ref 0–35)
AST: 16 U/L (ref 0–35)
Albumin: 4.3 g/dL (ref 3.5–5.2)
Alk Phos: 118 U/L — ABNORMAL HIGH (ref 35–105)
Anion Gap: 10 (ref 7–16)
Bilirubin,Total: 0.3 mg/dL (ref 0.0–1.2)
CO2: 26 mmol/L (ref 20–28)
Calcium: 9.6 mg/dL (ref 8.6–10.2)
Chloride: 103 mmol/L (ref 96–108)
Creatinine: 0.88 mg/dL (ref 0.51–0.95)
GFR,Black: 81 *
GFR,Caucasian: 71 *
Glucose: 99 mg/dL (ref 60–99)
Lab: 18 mg/dL (ref 6–20)
Potassium: 4 mmol/L (ref 3.4–4.7)
Sodium: 139 mmol/L (ref 133–145)
Total Protein: 6.7 g/dL (ref 6.3–7.7)

## 2019-07-11 LAB — CBC
Hematocrit: 49 % — ABNORMAL HIGH (ref 34–45)
Hemoglobin: 16.2 g/dL — ABNORMAL HIGH (ref 11.2–15.7)
MCH: 33 pg/cell — ABNORMAL HIGH (ref 26–32)
MCHC: 33 g/dL (ref 32–36)
MCV: 98 fL — ABNORMAL HIGH (ref 79–95)
Platelets: 298 10*3/uL (ref 160–370)
RBC: 5 MIL/uL (ref 3.9–5.2)
RDW: 13.6 % (ref 11.7–14.4)
WBC: 11.5 10*3/uL — ABNORMAL HIGH (ref 4.0–10.0)

## 2019-07-13 ENCOUNTER — Other Ambulatory Visit
Admission: RE | Admit: 2019-07-13 | Discharge: 2019-07-13 | Disposition: A | Discharge status: Home / Self Care | Payer: No Typology Code available for payment source | Source: Ambulatory Visit | Attending: Nurse Practitioner | Admitting: Nurse Practitioner

## 2019-07-13 DIAGNOSIS — Z20828 Contact with and (suspected) exposure to other viral communicable diseases: Secondary | ICD-10-CM | POA: Insufficient documentation

## 2019-07-14 LAB — COVID-19 NAAT (PCR): COVID-19 NAAT (PCR): NEGATIVE

## 2019-07-14 LAB — COVID-19 PCR

## 2019-07-18 ENCOUNTER — Ambulatory Visit
Admission: RE | Admit: 2019-07-18 | Discharge: 2019-07-18 | Disposition: A | Discharge status: Home / Self Care | Payer: No Typology Code available for payment source | Source: Ambulatory Visit | Attending: Surgery | Admitting: Surgery

## 2019-07-18 ENCOUNTER — Ambulatory Visit: Payer: No Typology Code available for payment source | Admitting: Anesthesiology

## 2019-07-18 ENCOUNTER — Encounter
Admission: RE | Disposition: A | Discharge status: Home / Self Care | Payer: Self-pay | Source: Ambulatory Visit | Attending: Surgery

## 2019-07-18 DIAGNOSIS — K430 Incisional hernia with obstruction, without gangrene: Secondary | ICD-10-CM | POA: Insufficient documentation

## 2019-07-18 DIAGNOSIS — J449 Chronic obstructive pulmonary disease, unspecified: Secondary | ICD-10-CM | POA: Insufficient documentation

## 2019-07-18 DIAGNOSIS — F17210 Nicotine dependence, cigarettes, uncomplicated: Secondary | ICD-10-CM | POA: Insufficient documentation

## 2019-07-18 DIAGNOSIS — K660 Peritoneal adhesions (postprocedural) (postinfection): Secondary | ICD-10-CM | POA: Insufficient documentation

## 2019-07-18 HISTORY — PX: PR LAPAROSCOPY REPAIR INCISIONAL HERNIA REDUCIBLE: 49654

## 2019-07-18 SURGERY — REPAIR, HERNIA, INCISIONAL, ROBOT-ASSISTED
Anesthesia: General | Site: Abdomen | Wound class: Clean

## 2019-07-18 MED ORDER — GABAPENTIN 300 MG PO CAPSULE *I*
ORAL_CAPSULE | ORAL | Status: AC
Start: 2019-07-18 — End: 2019-07-18
  Filled 2019-07-18: qty 1

## 2019-07-18 MED ORDER — CELECOXIB 200 MG PO CAPS *I*
ORAL_CAPSULE | ORAL | Status: AC
Start: 2019-07-18 — End: 2019-07-18
  Filled 2019-07-18: qty 1

## 2019-07-18 MED ORDER — MIDAZOLAM HCL 1 MG/ML IJ SOLN *I* WRAPPED
INTRAMUSCULAR | Status: AC
Start: 2019-07-18 — End: 2019-07-18
  Filled 2019-07-18: qty 2

## 2019-07-18 MED ORDER — CEFAZOLIN SODIUM 1 GM IV SOLR *I*
INTRAVENOUS | Status: DC | PRN
Start: 2019-07-18 — End: 2019-07-18
  Administered 2019-07-18: 2 g via INTRAVENOUS

## 2019-07-18 MED ORDER — ONDANSETRON 4 MG PO TBDP *I*
4.0000 mg | ORAL_TABLET | Freq: Once | ORAL | Status: DC | PRN
Start: 2019-07-18 — End: 2019-07-18

## 2019-07-18 MED ORDER — TRAMADOL HCL 50 MG PO TABS *I*
ORAL_TABLET | ORAL | Status: AC
Start: 2019-07-18 — End: 2019-07-18
  Filled 2019-07-18: qty 2

## 2019-07-18 MED ORDER — CEFAZOLIN 2000 MG IN STERILE WATER 20ML SYRINGE *I*
2000.0000 mg | PREFILLED_SYRINGE | Freq: Once | INTRAVENOUS | Status: DC
Start: 2019-07-18 — End: 2019-07-18

## 2019-07-18 MED ORDER — FENTANYL CITRATE 50 MCG/ML IJ SOLN *WRAPPED*
INTRAMUSCULAR | Status: AC
Start: 2019-07-18 — End: 2019-07-18
  Filled 2019-07-18: qty 2

## 2019-07-18 MED ORDER — CEFAZOLIN 2000 MG IN STERILE WATER 20ML SYRINGE *I*
PREFILLED_SYRINGE | INTRAVENOUS | Status: AC
Start: 2019-07-18 — End: 2019-07-18
  Filled 2019-07-18: qty 20

## 2019-07-18 MED ORDER — FENTANYL CITRATE 50 MCG/ML IJ SOLN *WRAPPED*
25.0000 ug | INTRAMUSCULAR | Status: DC | PRN
Start: 2019-07-18 — End: 2019-07-18
  Administered 2019-07-18 (×2): 25 ug via INTRAVENOUS

## 2019-07-18 MED ORDER — GABAPENTIN 300 MG PO CAPSULE *I*
300.0000 mg | ORAL_CAPSULE | Freq: Once | ORAL | Status: AC
Start: 2019-07-18 — End: 2019-07-18
  Administered 2019-07-18: 300 mg via ORAL

## 2019-07-18 MED ORDER — NEOSTIGMINE METHYLSULFATE 1 MG/ML IJ SOLN WRAPPED *I*
Status: DC | PRN
Start: 2019-07-18 — End: 2019-07-18
  Administered 2019-07-18: 3 mg via INTRAVENOUS

## 2019-07-18 MED ORDER — FENTANYL CITRATE 50 MCG/ML IJ SOLN *WRAPPED*
INTRAMUSCULAR | Status: DC | PRN
Start: 2019-07-18 — End: 2019-07-18
  Administered 2019-07-18: 100 ug via INTRAVENOUS

## 2019-07-18 MED ORDER — PROPOFOL 10 MG/ML IV EMUL (INTERMITTENT DOSING) WRAPPED *I*
INTRAVENOUS | Status: DC | PRN
Start: 2019-07-18 — End: 2019-07-18
  Administered 2019-07-18: 200 mg via INTRAVENOUS

## 2019-07-18 MED ORDER — ACETAMINOPHEN 500 MG PO TABS *I*
1000.0000 mg | ORAL_TABLET | Freq: Once | ORAL | Status: DC | PRN
Start: 2019-07-18 — End: 2019-07-18

## 2019-07-18 MED ORDER — MIDAZOLAM HCL 1 MG/ML IJ SOLN *I* WRAPPED
INTRAMUSCULAR | Status: DC | PRN
Start: 2019-07-18 — End: 2019-07-18
  Administered 2019-07-18: 2 mg via INTRAVENOUS

## 2019-07-18 MED ORDER — TRAMADOL HCL 50 MG PO TABS *I*
100.0000 mg | ORAL_TABLET | Freq: Once | ORAL | Status: AC
Start: 2019-07-18 — End: 2019-07-18
  Administered 2019-07-18: 100 mg via ORAL

## 2019-07-18 MED ORDER — ONDANSETRON HCL 2 MG/ML IV SOLN *I*
4.0000 mg | Freq: Once | INTRAMUSCULAR | Status: DC | PRN
Start: 2019-07-18 — End: 2019-07-18

## 2019-07-18 MED ORDER — ROCURONIUM BROMIDE 10 MG/ML IV SOLN *WRAPPED*
Status: AC
Start: 2019-07-18 — End: 2019-07-18
  Filled 2019-07-18: qty 5

## 2019-07-18 MED ORDER — ONDANSETRON HCL 2 MG/ML IV SOLN *I*
INTRAMUSCULAR | Status: AC
Start: 2019-07-18 — End: 2019-07-18
  Filled 2019-07-18: qty 2

## 2019-07-18 MED ORDER — LIDOCAINE HCL 2 % (PF) IJ SOLN *I*
INTRAMUSCULAR | Status: AC
Start: 2019-07-18 — End: 2019-07-18
  Filled 2019-07-18: qty 5

## 2019-07-18 MED ORDER — OXYCODONE HCL 5 MG PO TABS *I*
5.0000 mg | ORAL_TABLET | Freq: Once | ORAL | Status: AC | PRN
Start: 2019-07-18 — End: 2019-07-18
  Administered 2019-07-18: 5 mg via ORAL

## 2019-07-18 MED ORDER — TRAMADOL HCL 50 MG PO TABS *I*
50.0000 mg | ORAL_TABLET | Freq: Four times a day (QID) | ORAL | 0 refills | Status: DC | PRN
Start: 2019-07-18 — End: 2019-08-29

## 2019-07-18 MED ORDER — GLYCOPYRROLATE 0.2 MG/ML IJ SOLN *WRAPPED*
INTRAMUSCULAR | Status: AC
Start: 2019-07-18 — End: 2019-07-18
  Filled 2019-07-18: qty 3

## 2019-07-18 MED ORDER — ROCURONIUM BROMIDE 10 MG/ML IV SOLN *WRAPPED*
Status: DC | PRN
Start: 2019-07-18 — End: 2019-07-18
  Administered 2019-07-18: 50 mg via INTRAVENOUS

## 2019-07-18 MED ORDER — LACTATED RINGERS IV SOLN *I*
50.0000 mL/h | INTRAVENOUS | Status: DC
Start: 2019-07-18 — End: 2019-07-18
  Administered 2019-07-18: 50 mL/h via INTRAVENOUS

## 2019-07-18 MED ORDER — BUPIVACAINE HCL 0.25 % IJ SOLUTION *WRAPPED*
Status: DC | PRN
Start: 2019-07-18 — End: 2019-07-18
  Administered 2019-07-18: 15 mL via SUBCUTANEOUS

## 2019-07-18 MED ORDER — LIDOCAINE HCL 2 % IJ SOLN *I*
INTRAMUSCULAR | Status: DC | PRN
Start: 2019-07-18 — End: 2019-07-18
  Administered 2019-07-18: 100 mg via INTRAVENOUS

## 2019-07-18 MED ORDER — DEXAMETHASONE SODIUM PHOSPHATE 4 MG/ML INJ SOLN *WRAPPED*
INTRAMUSCULAR | Status: DC | PRN
Start: 2019-07-18 — End: 2019-07-18
  Administered 2019-07-18: 10 mg via INTRAVENOUS

## 2019-07-18 MED ORDER — DIPHENHYDRAMINE HCL 50 MG/ML IJ SOLN *I*
25.0000 mg | Freq: Once | INTRAMUSCULAR | Status: DC | PRN
Start: 2019-07-18 — End: 2019-07-18

## 2019-07-18 MED ORDER — OXYCODONE HCL 5 MG PO TABS *I*
ORAL_TABLET | ORAL | Status: AC
Start: 2019-07-18 — End: 2019-07-18
  Filled 2019-07-18: qty 1

## 2019-07-18 MED ORDER — GLYCOPYRROLATE 0.2 MG/ML IJ SOLN *WRAPPED*
INTRAMUSCULAR | Status: DC | PRN
Start: 2019-07-18 — End: 2019-07-18
  Administered 2019-07-18: 0.6 mg via INTRAVENOUS

## 2019-07-18 MED ORDER — ONDANSETRON HCL 2 MG/ML IV SOLN *I*
INTRAMUSCULAR | Status: DC | PRN
Start: 2019-07-18 — End: 2019-07-18
  Administered 2019-07-18: 4 mg via INTRAVENOUS

## 2019-07-18 MED ORDER — NEOSTIGMINE METHYLSULFATE 1 MG/ML IJ SOLN WRAPPED *I*
Status: AC
Start: 2019-07-18 — End: 2019-07-18
  Filled 2019-07-18: qty 3

## 2019-07-18 MED ORDER — CELECOXIB 200 MG PO CAPS *I*
200.0000 mg | ORAL_CAPSULE | Freq: Once | ORAL | Status: AC
Start: 2019-07-18 — End: 2019-07-18
  Administered 2019-07-18: 200 mg via ORAL

## 2019-07-18 MED ORDER — BUPIVACAINE HCL 0.25 % IJ SOLUTION *WRAPPED*
Status: AC
Start: 2019-07-18 — End: 2019-07-18
  Filled 2019-07-18: qty 30

## 2019-07-18 MED ORDER — IPRATROPIUM-ALBUTEROL 0.5-2.5 MG/3ML IN SOLN *I*
RESPIRATORY_TRACT | Status: AC
Start: 2019-07-18 — End: 2019-07-18
  Filled 2019-07-18: qty 3

## 2019-07-18 MED ORDER — PROPOFOL 10 MG/ML IV EMUL (INTERMITTENT DOSING) WRAPPED *I*
INTRAVENOUS | Status: AC
Start: 2019-07-18 — End: 2019-07-18
  Filled 2019-07-18: qty 20

## 2019-07-18 MED ORDER — DEXAMETHASONE SOD PHOSPHATE PF 10 MG/ML IJ SOLN *I*
INTRAMUSCULAR | Status: AC
Start: 2019-07-18 — End: 2019-07-18
  Filled 2019-07-18: qty 1

## 2019-07-18 MED ORDER — IPRATROPIUM-ALBUTEROL 0.5-2.5 MG/3ML IN SOLN *I*
3.0000 mL | Freq: Once | RESPIRATORY_TRACT | Status: AC
Start: 2019-07-18 — End: 2019-07-18
  Administered 2019-07-18: 3 mL via RESPIRATORY_TRACT

## 2019-07-18 SURGICAL SUPPLY — 27 items
BANDAGE FABRIC LF BANDAID 1 IN (Dressing) ×9 IMPLANT
BINDER ABDOMINAL UNIVERSAL (Dressing) ×2 IMPLANT
CANNULA SEAL 5-8MM DAVINCI (Supply) ×9 IMPLANT
CHLORAPREP 2% TINTED ORANGE 26ML (Supply) ×3 IMPLANT
CLOSURE SKIN STERI STRIP 1/2 (Dressing) ×3 IMPLANT
COVER MAYO STAND (Supply) ×3 IMPLANT
DRAPE ARM INSTRUMENT DAVINCI (Supply) ×9 IMPLANT
GLOVE BIOGEL PI ULTRATOUCH 8.0 PF (Glove) ×13 IMPLANT
GLOVE PI BIOGEL MICRO UNDERGLOVE  7.0 (Glove) ×8 IMPLANT
GOWN SURG ASTOUND LG (Gown) ×5 IMPLANT
HOLDER SCALPEL PLASTIC (Supply) ×3 IMPLANT
MESH SOFT 4INX6IN 0117010 (Implant) ×2 IMPLANT
OBTURATOR BLADLESS 8MM DAVINCI (Supply) ×3 IMPLANT
PACK DAVINCI (Pack) ×3 IMPLANT
PAD GROUNDING BOVIE ADULT (Supply) ×3 IMPLANT
PURE VIEW FILTR. DEVICE (PLUME AWAY) (Supply) ×3 IMPLANT
SCISSOR TIP MICROLINE (Supply) ×2 IMPLANT
SLEEVE SCD DVT MED (Supply) ×3 IMPLANT
SOL IRR WATER 500ML (Solution) ×3 IMPLANT
SPONGE ABD DRESSING 5X9 (Dressing) ×3 IMPLANT
SPONGE NONWOVEN TRAY 4X4 (Dressing) ×3 IMPLANT
SUT MONO 4-0 PS-2 Y496G (Suture) ×3 IMPLANT
SUT VLOC 3-0 GR 9 V20 VLOCL0644 (Suture) ×3 IMPLANT
SUT VLOC PBT 1 BLU 9GS-22 VLOCN2147 (Suture) ×3 IMPLANT
SYRINGE ECCENTRIC 50ML (Supply) ×3 IMPLANT
TROCAR/CANNULA Z-THREAD 5/100 CTF03 (Supply) ×3 IMPLANT
TUBING INSUFFLATION HEATED (Supply) ×3 IMPLANT

## 2019-07-18 NOTE — Anesthesia Postprocedure Evaluation (Signed)
Anesthesia Post-Op Note    Patient: Brandi Warner    Procedure(s) Performed:  Procedure Summary  Date:  07/18/2019 Anesthesia Start: 07/18/2019  1:20 PM Anesthesia Stop: 07/18/2019  2:48 PM Room / Location:  [id number] MAIN OR   Procedure(s):  REPAIR, HERNIA, INCISIONAL, ROBOT-ASSISTED  Lysis, Adhesions, Laparoscopic Diagnosis:  Right upper quadrant pain [R10.11] Surgeon(s):  Audelia Hives, MD Responsible Anesthesia Provider:  Jena Gauss, MD         Recovery Vitals  BP: 143/89 (07/18/2019 11:45 AM)  Heart Rate: 87 (07/18/2019 11:45 AM)  Resp: 20 (07/18/2019 11:45 AM)  Temp: 36.5 C (97.7 F) (07/18/2019 11:45 AM)  SpO2: 95 % (07/18/2019 11:45 AM)   0-10 Scale: 0 (07/18/2019 11:45 AM)    Anesthesia type:  general  Complications Noted During Procedure or in PACU:  None   Comment:    Patient Location:  PACU  Level of Consciousness:    Recovered to baseline  Patient Participation:     Able to participate  Temperature Status:    Normothermic  Oxygen Saturation:    Within patient's normal range and appropriate for condition  Cardiac Status:   Within patient's normal range  Fluid Status:    Stable  Airway Patency:     Yes  Pulmonary Status:    Baseline  Pain Management:    Satisfactory to patient  Nausea and Vomiting:  None    Post Op Assessment:    Tolerated procedure wellAttending Attestation:  All indicated post anesthesia care provided       -

## 2019-07-18 NOTE — Discharge Instructions (Signed)
Discharge Instructions - Hernia  Restrictions:  If you have received sedative medications and/or general anesthesia, you may be drowsy for as long as 24 hours.   Do not drive or operate machinery today.   Do not drink alcoholic beverages today.   Do not make major decisions, sign contracts, etc. today.    A responsible adult should be available to assist you at home.    Diet:  Start with liquids and advance to solid food as tolerated.    Activity:  Rest at home today, getting up to walk around your home several times during the evening to prevent getting overly stiff and sore. Increase activity tomorrow, walking longer distances at least 4 times/day with progressive activity daily. As you regain normal activity, you may find it neccessary to rest occassionally. No lifting more than the equivalent of a gallon of milk. Do not drive a car for 1-2 weeks. Before attempting to drive, sit in car and practice working pedals.    Wound Care:  Expect bruising below incision or groin area. May or may not be extensive. This is normal and will fade in 1-2 weeks. If you have steri-strips on the incision, you may shower in 2 days with these. They will curl up and fall off in 1-2 weeks. If you have a dressing, keep it and the incision cleans and dry. Despite the best of care, a wound can become infected. If yours becomes red, swollen, tender, shows pus or red streaks, feels increasingly sore or tender, or if you have chills or fever over 101 degrees, call your doctor.    Medications:  Tylenol 1000 mg three times a day  Tramadol PRN    Other:  Avoid constipation or straining at bowel movements. Increasing fresh fruits, vegetables and fluid intake will help. If necessary, take a laxative; Milk of Magnesia or Doxidan are good choices.    It is not uncommon to have shoulder pain after laparoscopy. This will dissipate in 24-48h.  Walking helps to improve this.      If You Have A Problem:  [id number]    If you have difficulty  contacting your physician, call:    [facility] [place]  [facility]  Emergency Department  [phone] or [phone]  7AM to 6PM weekdays  24 hours/day

## 2019-07-18 NOTE — Preop H&P (Signed)
Chief Complaint  Hernia  History of Present Illness  Pt is a 63 y.o.  female presents at [facility] for elective epigastric hernia repair        Past Medical History:   Diagnosis Date    Anxiety     Aortic valve disease     Arthritis     Barrett's esophagus     Colon polyp     COPD (chronic obstructive pulmonary disease)     Depression     GERD (gastroesophageal reflux disease)     Gout     Hiatal hernia     Hypothyroidism 08/05/2010    Migraine     once a week    PONV (postoperative nausea and vomiting)     after Gallbladder removed    Tobacco abuse      Past Surgical History:   Procedure Laterality Date    CHOLECYSTECTOMY  2005    po N/V kept overnight    nasal endoscopic polypectomy  [place]    with cyst    PR RECTAL TUMOR EXCISION, TRANSANAL ENDOSCOPIC MICROSURGICAL, FULL THICK N/A 06/09/2017    Procedure: TRANSANAL ENDOSCOPIC MICROSURGERY (TEMS) ;  Surgeon: Doren Custard, MD;  Location: [facility] MAIN OR;  Service: Colorectal    PR SIGMOIDOSCOPY FLX DX W/COLLJ SPEC BR/WA IF PFRMD N/A 05/17/2017    FLEXIBLE SIGMOIDOSCOPY;  Surgeon: Doren Custard, MD;  Location: [facility] OR;  Service: Colorectal, no anesthesia issues      Allergies   Allergen Reactions    No Known Drug Allergy      Created by Conversion - 0;      Current Facility-Administered Medications   Medication Dose Route Frequency    Lactated Ringers Infusion  50 mL/hr Intravenous Continuous    ceFAZolin (ANCEF) syringe 2,000 mg  2,000 mg Intravenous Once     Social History     Socioeconomic History    Marital status: Married     Spouse name: Not on file    Number of children: Not on file    Years of education: Not on file    Highest education level: Not on file   Occupational History    Not on file   Tobacco Use    Smoking status: Current Every Day Smoker     Packs/day: 0.50     Years: 39.00     Pack years: 19.50     Types: Cigarettes     Start date: 09/10/1977    Smokeless tobacco: Never Used    Substance and Sexual Activity    Alcohol use: No    Drug use: No    Sexual activity: Not on file   Social History Narrative    Not on file       Family History   Problem Relation Age of Onset    Pacemaker Mother     Heart surgery Mother 91        3 bypass    COPD Brother     Anesthesia problems Neg Hx          Review of Systems:  Pertinent items are noted in HPI.    Physical Exam:  Vitals:    07/18/19 1145   BP: 143/89   Pulse: 87   Resp: 20   Temp: 97.7 F   Weight: 234 lb 9.1 oz     Neuro: A+O x 3  Chest: CTA  Cor: RRR  Abdomen is soft  Epigastric hernia  Ext: no edema    Assessment and Plan:  Hernia    The pathophysiology of Hernia discussed.  Risks and benefits of surgery discussed and questions answered.  Proceed with Robotic hernia repair        [place]    PATIENT: [place]  OR SURGICAL PROCEDURE                             Hector [address] MR                                                                  Please read this form or have someone read it to you.   It's important to understand all parts of this form. If something isn't clear, ask [place] to explain.   When you sign it, that means you understand the form and give [place] permission to do this surgery or procedure.     I agree for Audelia Hives, MD along with any assistants* they may choose, to treat the following condition(s):    By doing this surgery or procedure on me:    This is also known as: Robotic epigastric Hernia Repair   Laterality:     *if you'd like a list of the assistants, please ask. We can give that to you.    1. The care provider has explained my condition to me. They have told me how the procedure can help me. They have told me about other ways of treating my condition. I understand the care provider cannot guarantee the result of the procedure. If I don't have this procedure, my other choices are:     2. The care provider has told me the risks  (problems that can happen) of the procedure. I understand there may be unwanted results. The risks that are related to this procedure include: Bleeding, infection, open operation    3. I understand that during the procedure, my care provider may find a condition that we didn't know about before the treatment started. Therefore, I agree that my care provider can perform any other treatment which they think is necessary and available.    4. I understand the care provider may remove tissue, body parts, or materials during this procedure. These materials may be used to help with my diagnosis and treatment. They might also be used for teaching purposes or for research studies that I have separately agreed to participate in. Otherwise they will be disposed of as required by law.    5. My care provider might want a representative from a [employer] to be there during my procedure. I understand that person works for:          The ways they might help my care provider during my procedure include:            6. Here are my decisions about receiving blood, blood products, or tissues. I understand my decisions cover the time before, during and after my procedure, my treatment, and my time in the hospital. After my procedure, if my condition changes a lot, my care provider will talk with me again about  receiving blood or blood products. At that time, my care provider might need me to review and sign another consent form, about getting or refusing blood.    I understand that the blood is from the community blood supply. Volunteers donated the blood, the volunteers were screened for health problems. The blood was examined with very sensitive and accurate tests to look for hepatitis, HIV/AIDS, and other diseases. Before I receive blood, it is tested again to make sure it is the correct type.    My chances of getting a sickness from blood products are small. But no transfusion is 100% safe. I understand that my care  provider feels the good I will receive from the blood is greater than the chances of something going wrong. My care provider has answered my questions about blood products.      My decision  about blood or  blood products   Yes, I agree to receive blood or blood products if my care provider thinks they're needed.        My decision   about tissue  Implants     Yes, I agree to receive tissue implants if my care provider thinks they're needed.          I understand this  form.    My care provider  or his/her  assistants have  explained:   What I am having done and why I need it.  What other choices I can make instead of having this done.  The benefits and possible risks (problems) to me of having this done.  The benefits and possible risks (problems) to me of receiving transplants, blood, or blood products.  There is no guarantee of the results.  The care provider may not stay with me the entire time that I am in the operating or procedure room.  My provider has explained how this may affect my procedure. My provider has answered my  questions about this.         I give my  permission for  this surgery or  procedure.            _______________________________________________                                     My signature  (or parent or other person authorized to sign for you, if you are unable to sign for  yourself or if you are under 50 years old)        ______           Date        _____        Time   Electronic Signatures will display at the bottom of the consent form.    Care provider's statement: I have discussed the planned procedure, including the possibility for transfusion of blood  products or receipt of tissue as necessary; expected benefits; the possible complications and risks; and possible alternatives  and their benefits and risks with the patients or the patient's surrogate. In my opinion, the patient or the patient's surrogate  understands the proposed procedure, its risks, benefits and  alternatives.              Electronically signed by: Audelia Hives, MD  Date                Time   Your doctor or someone your doctor has appointed has told you that you may need blood or a blood product transfusion, which has been collected from volunteers, as part of your treatment as a patient.    The reasons you might need blood or blood products include, but are not limited to:    Significant loss of your own blood   Your body may not be getting enough oxygen to its tissues    Treatment of bleeding disorders caused by low platelet counts or platelets that do not work right (platelets are part of a cell that helps to form clots and keeps you from bleeding too much).   You may not have enough of other substances that help your blood clot or stop you from bleeding more  The risks of getting a transfusion of blood or blood products include, but are not limited to:    Damage to the lungs   Difficulty breathing due to fluid in the lungs   The product may contain bacteria or rarely a virus (which includes HIV and Hepatitis        Informed consent reviewed with [name] . The patient understands the risks and benefits and gives verbal consent.  Due to COVID-19 Infection Risk Reduction measures, the patient is giving verbal consent and not using a pen to sign.

## 2019-07-18 NOTE — Op Note (Signed)
PATIENT:   Brandi Warner, PULLAR MR #:  [id number]   CSN:  [id number] DOB:  1956-07-19    AGE:  62     SURGEON:  Audelia Hives, MD  CO-SURGEON:    ASSISTANTMelrose Nakayama  SURGERY DATE:  07/18/2019    PREOPERATIVE DIAGNOSIS:  Right-sided abdominal pain; incarcerated incisional hernia.    OPERATIVE PROCEDURE:    1. Diagnostic laparoscopy with laparoscopic lysis of adhesions.  2. Laparoscopic robotic repair of incisional hernia (incarcerated, with mesh).    ANESTHESIA:  General.    ESTIMATED BLOOD LOSS:  Minimal.    FINDINGS:  Above.    PROCEDURE IN DETAIL:  After informed consent was obtained, the patient was taken to the operative suite and laid in the supine position.  After administration of general anesthesia, the abdomen was prepped in the usual fashion.  A timeout was performed and anesthetic given.     In left upper quadrant, a 5 mm skin incision was made, and peritoneal cavity was entered under direct visualization using a 5 mm Visiport.  The abdomen was insufflated to 15 mmHg.  Upon entry, there was noted to be adhesions throughout the abdominal wall but very dense in the right upper quadrant.  Accessory ports were placed and laparoscopic lysis of adhesions was performed to free the entire abdominal wall of small bowel and omental adhesions.  This was all done sharply.     Once this was freed, robotic ports were placed, and the rest of the procedure was performed robotically.     Left of the peritoneal defect, the peritoneum was divided and, in the preperitoneal space, a circumferential dissection was performed, and the epigastric incisional hernia, which appeared to be a previous port site, the incarcerated fat was reduced.  The fascial defect was completely elucidated.     This was closed with a #1 nonabsorbable V-Loc suture.  This approximated well without significant tension.  Next, further space was developed in the preperitoneal space, and a 10 x 6 cm Bard soft mesh was introduced, positioned as an underlay with  equidistant overlap underneath the closed fascial defect.     The peritoneum was reapproximated over the fascia with 3-0 absorbable V-Loc sutures.     Ports were removed, skin incisions closed with 4-0 Monocryl.     Patient tolerated the procedure well, and all sponge and needle counts were correct.             ______________________________  Audelia Hives, MD    ADP/MODL  DD:  07/18/2019 16:54:18  DT:  07/18/2019 18:32:02  Job #:  [id number]    cc:

## 2019-07-18 NOTE — Anesthesia Preprocedure Evaluation (Addendum)
Anesthesia Pre-operative History and Physical for Brandi Warner  .  .  .  Anesthesia Evaluation Information Source: patient         GENERAL     Denies general issues    HEENT     Denies HEENT issues PULMONARY    + COPD          severe    CARDIOVASCULAR     Denies cardiovascular issues    GI/HEPATIC/RENAL    + GERD   + Esophageal Issues NEURO/PSYCH    + Headaches    + Psychiatric Issues    ENDO/OTHER    + Thyroid Disease    HEMATOLOGIC    + Arthritis         Physical Exam    Airway            Mallampati: IV            TM distance (fb): >3 FB            TM distance (cm): 3            Neck ROM: full  Dental   Normal Exam   Cardiovascular  Normal Exam      Neurologic    Normal Exam     Pulmonary     + Wheezes  expiratory, Improved after bronchodilator   Comment: Duoneb inhaler ordered for patient pre-op.    Mental Status   Normal Exam         ________________________________________________________________________  PLAN  ASA Score  3  Anesthetic Plan general     Induction (routine IV) General Anesthesia/Sedation Maintenance Plan (inhaled agents);  Airway Manipulation (video laryngoscope); Airway (cuffed ETT); Line ( use current access); Monitoring (standard ASA); Positioning (supine); PONV Plan (ondansetron and dexamethasone); Pain (per surgical team); PostOp (PACU)    Informed Consent     Risks:         Risks discussed were commensurate with the plan listed above with the following specific points: N/V, aspiration, sore throat and hypotension, Damage to: teeth, unexpected serious injury.    Anesthetic Consent:         Anesthetic plan (and risks as noted above) were discussed with patient    Responsible Anesthesia Attestation:  I attest that the patient or proxy understands and accepts the risks and benefits of the anesthesia plan. I also attest that I have personally performed a pre-anesthetic examination and evaluation, and prescribed the anesthetic plan for this particular location within 48 hours prior to the  anesthetic as documented. Jena Gauss, MD  07/18/19, 1:48 PM

## 2019-07-18 NOTE — Anesthesia Case Conclusion (Signed)
CASE CONCLUSION  [facility]  Actions:  Suctioned and extubated  Criteria Used for Airway Removal:  Adequate Tv & RR, acceptable O2 saturation and following commands  Assessment:  Routine  Transport  Directly to: PACU  Position:  Supine  Patient Condition on Handoff  Patient Condition:  Stable  Handoff Report to:  RN

## 2019-07-18 NOTE — Anesthesia Procedure Notes (Signed)
---------------------------------------------------------------------------------------------------------------------------------------    AIRWAY   GENERAL INFORMATION AND STAFF    Patient location during procedure: OR       Date of Procedure: 07/18/2019 1:30 PM  CONDITION PRIOR TO MANIPULATION     Current Airway/Neck Condition:  Normal        For more airway physical exam details, see Anesthesia PreOp Evaluation  AIRWAY METHOD     Patient Position:  Sniffing    Preoxygenated: yes      Induction: IV    Mask Difficulty Assessment:  1 - vent by mask      Mask NMB: 1 - vent by mask      Technique Used for Successful ETT Placement:  Video laryngoscopy    Devices/Methods Used in Placement:  Intubating stylet    Blade Type:  Macintosh    Laryngoscope Blade/Video laryngoscope Blade Size:  3    Cormack-Lehane Classification:  Grade I - full view of glottis    Placement Verified by: capnometry and auscultation      Number of Attempts at Approach:  1  FINAL AIRWAY DETAILS    Final Airway Type:  Endotracheal airway    Final Endotracheal Airway:  ETT      Cuffed: cuffed    Insertion Site:  Oral    ETT Size (mm):  7.0    Distance inserted from Lips (cm):  22  ADDITIONAL COMMENTS   Easy intubation using Glidescope/MAC3 blade. No complications. No dental trauma.   ----------------------------------------------------------------------------------------------------------------------------------------

## 2019-07-18 NOTE — Progress Notes (Addendum)
Pt transported via wheelchair to vehicle for discharge home.     Ottilie Wigglesworth Barnard Talley, RN        Discharge instructions discussed with pt, no questions at this time.     IV catheter removed, cath intact.     Moraima Burd Barnard Talley, RN

## 2019-08-06 ENCOUNTER — Encounter: Payer: Self-pay | Admitting: Physical Medicine and Rehabilitation

## 2019-08-06 ENCOUNTER — Ambulatory Visit
Payer: No Typology Code available for payment source | Attending: Physical Medicine and Rehabilitation | Admitting: Physical Medicine and Rehabilitation

## 2019-08-06 ENCOUNTER — Encounter: Payer: No Typology Code available for payment source | Admitting: Surgery

## 2019-08-06 VITALS — Temp 97.2°F | Ht 65.08 in | Wt 234.0 lb

## 2019-08-06 DIAGNOSIS — G5601 Carpal tunnel syndrome, right upper limb: Secondary | ICD-10-CM

## 2019-08-06 NOTE — Procedures (Signed)
[facility]  [place], [place] [address]  [phone]  Test Date:  08/06/2019    Patient: Brandi Warner DOB: [birth year] Physician: Dr.Zoii Florer Kendrick Fries MD   Sex: Female Height: 5' 5.08" Ref Phys: Susy Manor   ID#: [id number] Weight: 234 lbs. Technician: Denice Paradise     Patient Complaints: She is here for evaluation of right hand pain paresthesia numbness and tingling.  She is specifically referred to rule out carpal tunnel syndrome.  Right Upper Extremity    Exam: On exam there is no atrophy of either upper limb.  Strength sensation reflexes normal.  Spurling's test negative.      Limb temperature monitoring, with limb temperature maintained greater than 32C in the upper extremities and greater than 30C in the lower extremities.     NCV & EMG Findings:     Evaluation of the right median/radial (dig I) comparison nerve showed prolonged distal peak latency (Median, 3.5 ms) and abnormal peak latency difference (Median-Radial, 1.2 ms).   All remaining nerves (as indicated in the following tables) were within normal limits.     EMG of the median innervated thenar muscles is normal.   Ultrasound imaging demonstrates normal cross-sectional area of the median nerve at the wrist with a normal forearm to wrist ratio.      Impression:  1.  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  Ultrasound findings are within normal limits.  2.  There is no electrodiagnostic evidence for neuropathy in the right upper limb.    Recommendations:  She has follow-up with hand surgeon in the near future        ___________________________  Dr.Rhyann Berton Kendrick Fries MD        Nerve Conduction Studies  Anti Sensory Summary Table     Stim Site NR Peak (ms) Norm Peak (ms) O-P Amp (V) Norm O-P Amp Site1 Site2 Delta-0 (ms) Dist (cm) Vel (m/s) Norm Vel (m/s)   Right Median Anti Sensory (2nd Digit)   Wrist    4.0 <4 12.1 >8 Wrist Palm 1.8 7.0 39 >35   Palm    1.8   13.6  Palm 2nd Digit 1.4 7.0 50    Right Ulnar Anti Sensory (5th Digit)   Wrist    3.1 <4 16.5 >4 Wrist 5th Digit 2.2 14.0 64 >35     Motor Summary Table     Stim Site NR Onset (ms) Norm Onset (ms) O-P Amp (mV) Norm O-P Amp Site1 Site2 Delta-0 (ms) Dist (cm) Vel (m/s) Norm Vel (m/s)   Right Median Motor (Abd Poll Brev)   Wrist    4.2 <4.4 8.5 >3.8 Elbow Wrist 3.8 22.0 58 >51   Elbow    8.0  8.4          Right Ulnar Motor (Abd Dig Min)   Wrist    2.8 <3.7 9.5 >7.9 B Elbow Wrist 3.3 20.5 62 >52   B Elbow    6.1  8.4  A Elbow B Elbow 1.3 10.4 80 >43   A Elbow    7.4  8.4            Comparison Summary Table     Stim Site NR Peak (ms) Norm Peak (ms) O-P Amp (V) Site1 Site2 Delta-P (ms) Norm Delta (ms)   Right Median/Radial Dig I Comparison (Digit 1 - 10cm)   Median    3.5 <2.9 12.7 Median Radial 1.2 <0.4  Radial    2.3 <2.8 10.2         EMG     Side Muscle Nerve Root Ins Act Fibs Psw Amp Dur Poly Recrt Int Fraser Din Comment   Right Opp Pollicis Median 0000000 Nml Nml Nml Nml Nml Nml Nml Nml            Waveforms:                    Ultrasound Images:

## 2019-08-21 ENCOUNTER — Ambulatory Visit: Payer: No Typology Code available for payment source | Admitting: Surgery

## 2019-08-21 ENCOUNTER — Encounter: Payer: Self-pay | Admitting: Surgery

## 2019-08-21 DIAGNOSIS — K430 Incisional hernia with obstruction, without gangrene: Secondary | ICD-10-CM

## 2019-08-27 NOTE — Progress Notes (Signed)
Chief Complaint  Post op hernia    History of Present Illness  Pt is a 64 y.o.  female presents at office for postoperative appointment for ventral hernia  Doing well overall still has some RUQ pain    Past Medical History:   Diagnosis Date    Anxiety     Aortic valve disease     Arthritis     Barrett's esophagus     Colon polyp     COPD (chronic obstructive pulmonary disease)     Depression     GERD (gastroesophageal reflux disease)     Gout     Hiatal hernia     Hypothyroidism 08/05/2010    Migraine     once a week    PONV (postoperative nausea and vomiting)     after Gallbladder removed    Tobacco abuse      Past Surgical History:   Procedure Laterality Date    CHOLECYSTECTOMY  2005    po N/V kept overnight    nasal endoscopic polypectomy  [place]    with cyst    PR LAP, INCISIONAL HERNIA REPAIR,REDUCIBLE N/A 07/18/2019    Procedure: REPAIR, HERNIA, INCISIONAL, ROBOT-ASSISTED;  Surgeon: Audelia Hives, MD;  Location: [facility] MAIN OR;  Service: General    PR RECTAL TUMOR EXCISION, TRANSANAL ENDOSCOPIC MICROSURGICAL, FULL THICK N/A 06/09/2017    Procedure: TRANSANAL ENDOSCOPIC MICROSURGERY (TEMS) ;  Surgeon: Doren Custard, MD;  Location: [facility] MAIN OR;  Service: Colorectal    PR SIGMOIDOSCOPY FLX DX W/COLLJ SPEC BR/WA IF PFRMD N/A 05/17/2017    FLEXIBLE SIGMOIDOSCOPY;  Surgeon: Doren Custard, MD;  Location: [facility] OR;  Service: Colorectal, no anesthesia issues      Allergies   Allergen Reactions    No Known Drug Allergy      Created by Conversion - 0;      Current Outpatient Medications   Medication    Diclofenac Sodium CR (VOLTARIN-XR) 100 MG 24 hr tablet    Multiple Vitamin (MULTI VITAMIN DAILY PO)    ARIPiprazole (ABILIFY) 2 MG tablet    citalopram (CELEXA) 40 MG tablet    esomeprazole (NEXIUM) 40 MG capsule    levothyroxine (SYNTHROID, LEVOTHROID) 75 MCG tablet    allopurinol (ZYLOPRIM) 100 MG tablet    zolpidem (AMBIEN) 10 MG tablet    aspirin 81 MG  tablet    Cyanocobalamin (VITAMIN B 12 PO)    traMADol (ULTRAM) 50 MG tablet    methylPREDNISolone (MEDROL PAK) 4 MG tablet pack    ipratropium-albuterol (DUONEB) 0.5-2.5mg  /21mL nebulizer solution    ALPRAZolam (XANAX) 0.25 MG tablet    rizatriptan (MAXALT) 10 MG tablet     No current facility-administered medications for this visit.      Social History     Socioeconomic History    Marital status: Married     Spouse name: Not on file    Number of children: Not on file    Years of education: Not on file    Highest education level: Not on file   Occupational History    Not on file   Tobacco Use    Smoking status: Current Every Day Smoker     Packs/day: 0.50     Years: 39.00     Pack years: 19.50     Types: Cigarettes     Start date: 09/10/1977    Smokeless tobacco: Never Used   Substance and Sexual Activity    Alcohol  use: No    Drug use: No    Sexual activity: Not on file   Social History Narrative    Not on file       Family History   Problem Relation Age of Onset    Pacemaker Mother     Heart surgery Mother 75        3 bypass    COPD Brother     Anesthesia problems Neg Hx          Review of Systems:  Pertinent items are noted in HPI.    Physical Exam:  There were no vitals filed for this visit.  Neuro: A+O x 3  Chest: CTA  Cor: RRR  Abdomen is soft  Incisions clean dry and intact  Hernia repair intact well healed  Ext: no edema    Imaging and labs reviewed    Assessment and Plan:  Post Op Hernia    Doing well, increase activity slowly with normal activity level at 6 week mark.  I suspect her RUQ pain is mostly related to abdominal wall stretching vs liver capsule, she has gained weight and continues to do so.  She will attempt to increase activity to lose weight  Call with any questions or concerns.  F/U as needed  No follow-ups on file.

## 2019-08-29 ENCOUNTER — Encounter: Payer: Self-pay | Admitting: Orthopedic Surgery

## 2019-08-29 ENCOUNTER — Ambulatory Visit: Payer: No Typology Code available for payment source | Admitting: Orthopedic Surgery

## 2019-08-29 VITALS — BP 126/77 | HR 94 | Ht 65.0 in | Wt 234.0 lb

## 2019-08-29 DIAGNOSIS — G5600 Carpal tunnel syndrome, unspecified upper limb: Secondary | ICD-10-CM

## 2019-08-29 DIAGNOSIS — M65831 Other synovitis and tenosynovitis, right forearm: Secondary | ICD-10-CM

## 2019-08-29 MED ORDER — LIDOCAINE HCL 1 % IJ SOLN *I*
0.5000 mL | Freq: Once | INTRAMUSCULAR | Status: AC | PRN
Start: 2019-08-29 — End: 2019-08-29
  Administered 2019-08-29: 14:00:00 .5 mL via SUBCUTANEOUS

## 2019-08-29 MED ORDER — ETHYL CHLORIDE EX AERO (MULTIPLE PATIENTS) *I*
1.0000 | INHALATION_SPRAY | Freq: Once | CUTANEOUS | Status: AC | PRN
Start: 2019-08-29 — End: 2019-08-29
  Administered 2019-08-29: 14:00:00 1 via TOPICAL

## 2019-08-29 MED ORDER — BETAMETHASONE ACET & SOD PHOS 6 (3-3) MG/ML IJ SUSP *I*
3.0000 mg | Freq: Once | INTRAMUSCULAR | Status: AC | PRN
Start: 2019-08-29 — End: 2019-08-29
  Administered 2019-08-29: 14:00:00 3 mg via SUBCUTANEOUS

## 2019-08-29 NOTE — Procedures (Signed)
Injection - Tendon/Other: R carpal tunnel    Date/Time: 08/29/2019  2:15 PM EST  Consent given by: patient  Site marked: site marked  Timeout: Immediately prior to procedure a time out was called to verify the correct patient, procedure, equipment, support staff and site/side marked as required     Procedure Details  Condition: carpal tunnel syndrome   Site: R carpal tunnel  Preparation: The site was prepped using the usual aseptic technique.  Anesthetics administered: 1 spray ethyl chloride; 0.5 mL lidocaine hcl 1 %  Steroids administered: 3 mg betamethasone acetate & sodium phosphate 6 (3-3) MG/ML  Dressing:  A dry, sterile dressing was applied.  Patient tolerance: patient tolerated the procedure well with no immediate complications

## 2019-08-29 NOTE — Progress Notes (Addendum)
I saw and evaluated the patient in the office today with [name], PA-C, and agree with her note and findings. All physical examination measurements were either taken by myself and dictated to Brandi Warner, or were confirmed by my own measurements. I personally discussed the treatment plans in detail with the patient with Brandi Warner acting as a scribe.    Reviewed the results of the EMG in the office today with the patient and her husband.   She has mild carpal tunnel syndrome, evidenced by the decreased conduction velocity across the carpal tunnel on the median nerve, increased distal latency of 3.5 ms and abnormal median-to-radial nerve comparison .   Ultrasound analysis of the median nerve was within normal limits.   Ulnar nerve was within normal limits.   Discussed her mild findings and discussed cortisone injection for diagnostic/prognostic/therapueitc purposes.     Risks and benefits of the corticosteroid injection were discussed in detail. The risks include pain, infection, bleeding, subcutaneous fat atrophy, and skin depigmentation. Right carpal tunnel injected.   The patient tolerated the injection well and left stable. The patient was made well aware that the injection could take several days to take effect. Please see procedure documentation note for further details.   Brandi Chancellor, MD              Reason for Appointment   Mild right CTS  Right hand/wrist pain and swelling     History of Present Illness   64 y.o. right hand dominant, non diabtiec female.  She was seen by Brandi Castilla Hofmeister, PA for hand/wrist pain with numbness in the right index/middle/ring fingers. Symptoms present for several months. No injury. She has been wearing a night splint for several months with improvement in nocturnal numbness. Still getting daytime numbness.  She is here following EMG studies which revealed mild right carpal syndrome. Also has intermittent swelling with pain over the dorsal hand/wrist.  No injury.  2 months  ago she was given a short course of oral steroid and has been taking oral tramadol which has not been helping.  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 She is accompanied to the office by her husband today.         Past medical history, past surgical history, medications, allergies, family history, social history, and review of systems were reviewed today and have been documented separately in this encounter.  Significant findings include:    Past Medical History  Past Medical History:   Diagnosis Date    Anxiety     Aortic valve disease     Arthritis     Barrett's esophagus     Colon polyp     COPD (chronic obstructive pulmonary disease)     Depression     GERD (gastroesophageal reflux disease)     Gout     Hiatal hernia     Hypothyroidism 08/05/2010    Migraine     once a week    PONV (postoperative nausea and vomiting)     after Gallbladder removed    Tobacco abuse      Past Surgical History  Past Surgical History:   Procedure Laterality Date    CHOLECYSTECTOMY  2005    po N/V kept overnight    nasal endoscopic polypectomy  [place] [facility] 1976    with cyst    PR LAP, INCISIONAL HERNIA REPAIR,REDUCIBLE N/A 07/18/2019    Procedure: REPAIR, HERNIA, INCISIONAL, ROBOT-ASSISTED;  Surgeon: Collier Brandi, Warner  D, MD;  Location: [facility] MAIN OR;  Service: General    PR RECTAL TUMOR EXCISION, TRANSANAL ENDOSCOPIC MICROSURGICAL, FULL THICK N/A 06/09/2017    Procedure: TRANSANAL ENDOSCOPIC MICROSURGERY (TEMS) ;  Surgeon: Brandi Custard, MD;  Location: [facility] MAIN OR;  Service: Colorectal    PR SIGMOIDOSCOPY FLX DX W/COLLJ SPEC BR/WA IF PFRMD N/A 05/17/2017    FLEXIBLE SIGMOIDOSCOPY;  Surgeon: Brandi Custard, MD;  Location: [facility] OR;  Service: Colorectal, no anesthesia issues      Medications  Please refer to the eRecord for a current list of the patient's medications.    Allergies  Allergies   Allergen Reactions    No Known Drug Allergy      Created by Conversion - 0;       Social History  Social History     Socioeconomic History    Marital status: Married     Spouse name: Not on file    Number of children: Not on file    Years of education: Not on file    Highest education level: Not on file   Occupational History    Not on file   Tobacco Use    Smoking status: Current Every Day Smoker     Packs/day: 0.50     Years: 39.00     Pack years: 19.50     Types: Cigarettes     Start date: 09/10/1977    Smokeless tobacco: Never Used   Substance and Sexual Activity    Alcohol use: No    Drug use: No    Sexual activity: Not on file   Social History Narrative    Not on file     Family History  Family History   Problem Relation Age of Onset    Pacemaker Mother     Heart surgery Mother 62        3 bypass    COPD Brother     Anesthesia problems Neg Hx      Review of Systems  Review of systems including Gastrointestinal, Urinary, Neurologic, Skin, Vascular, Hematologic, Lymphatic, Cardiac and Pulmonary, Endocrine and Musculoskeletal reveals: As per HPI and PMH.    Examination   Vitals:    08/29/19 1352   BP: 126/77   Pulse: 94   Weight: 106.1 kg (234 lb)   Height: 1.651 m (5\' 5" )     Alert and oriented.  No acute distress.    RUE:  Skin is intact with no evidence of infection.  Mild swelling over the dorsal hand and wrist.  Mild tenderness over the volar and dorsal wrist.  Mild pain over the dorsal hand/wrist with resisted extension of the index/middle/ring fingers.  WE/WF: 45/55 with increased volar wrist pain in flexion.  Digital flexion to only touching the palm secondary to stiffness.  Thumb opposition to 1 cm from the ring MCP joint.  Tinel's sign is negative at the wrist.  Sensation intact in the median/radial/ulnar nerve distributions and symmetric.  Warm, well-perfused hand with brisk capillary refill.       Diagnostic Tests  Right UE EMG studies on 08/06/2019 revealed:  1.  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   Ultrasound findings are within normal limits.  2.  There is no electrodiagnostic evidence for neuropathy in the right upper limb.    X-rays of the right hand on 07/05/2019 revealed Mild generalized osteopenia is present. No acute fracture or dislocation or significant arthritic changes are seen.  Negative ulnar variance is present.    Impression   Mild right carpal tunnel syndrome  Right wrist extensor tenosynovitis    Plan   Dr. Theodoro Doing reviewed the EMG studies with the patient.  She has findings of mild carpal tunnel syndrome.  Night splinting has been helpful for nocturnal symptoms but she is still getting daytime numbness.  Reviewed the option of a cortisone injection in the carpal tunnel which she was amenable to today.  Dr. Theodoro Doing provided a cortisone injection in the right carpal tunnel.  She tolerated it well.  See separate procedure note for details.  Mild fullness and tenderness over the dorsal right wrist and hand consistent with extensor tenosynovitis.  Will observe it at this time as her swelling is not that bad today.  She'll return in 8 weeks for evaluation.  All questions answered to her satisfaction.

## 2019-10-18 ENCOUNTER — Other Ambulatory Visit: Payer: Self-pay | Admitting: Family Medicine

## 2019-10-18 DIAGNOSIS — Z1231 Encounter for screening mammogram for malignant neoplasm of breast: Secondary | ICD-10-CM

## 2019-10-24 ENCOUNTER — Encounter: Payer: Self-pay | Admitting: Orthopedic Surgery

## 2019-10-24 ENCOUNTER — Ambulatory Visit: Payer: No Typology Code available for payment source | Admitting: Orthopedic Surgery

## 2019-10-24 VITALS — BP 118/70 | Ht 65.0 in | Wt 235.0 lb

## 2019-10-24 DIAGNOSIS — G5601 Carpal tunnel syndrome, right upper limb: Secondary | ICD-10-CM

## 2019-10-24 DIAGNOSIS — M720 Palmar fascial fibromatosis [Dupuytren]: Secondary | ICD-10-CM

## 2019-10-24 DIAGNOSIS — M65831 Other synovitis and tenosynovitis, right forearm: Secondary | ICD-10-CM

## 2019-10-24 NOTE — Progress Notes (Addendum)
I saw and evaluated the patient in the office today with [name], PA-C, and agree with her note and findings. All physical examination measurements were either taken by myself and dictated to [name], or were confirmed by my own measurements. I personally discussed the treatment plans in detail with the patient with [name] acting as a scribe.    Night splints continue to help her, but she had minimal improvement following cortisone injections into her carpal tunnels.   She notes that her symptoms are manageable with the splinting and that she will call if there is any worsening.   Follow up in 3 months.   If worsening symptoms, will discuss surgery at that time.   Lenward Chancellor, MD            Reason for Appointment   Mild right carpal tunnel syndrome  Right wrist extensor tenosynovitis  Right palm cord     History of Present Illness   Returns following cortisone injection in the right carpal tunnel.  States she had no improvement in symptoms, still getting numbness and tingling in the hand.  She is still wearing a night splint.  She was also evaluated for extensor tenosynovitis over the dorsal hand/wrist.  Still has mild swelling, no worse.  Symptoms do not bother her that much.  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   Since her last visit she has developed a thickened cord over the right palm.   No pain associated with it.  She is accompanied to the office by her husband today.        Examination   Vitals:    10/24/19 1347   BP: 118/70   Weight: 106.6 kg (235 lb)   Height: 1.651 m (5\' 5" )     Alert and oriented.  No acute distress.    RUE:  Skin is intact with no evidence of infection.  Dupuytren's cord over the ring finger distal palmar crease.  No contractures noted. No tenderness,  Able to flatten her hand on the table.  Able to hyperextend the MCP joints.  Mild swelling over the dorsal hand and wrist.  Minimal tenderness over the volar and dorsal wrist.  Mild pain over the  dorsal hand/wrist with resisted extension of the index/middle/ring fingers.  WE/WF: 45/55 with increased volar wrist pain in flexion.  Digital flexion to only touching the palm secondary to stiffness.  Thumb opposition to 1 cm from the ring MCP joint.  Tinel's sign is negative at the wrist.  APB muscle strength is weaker on the right than the left.  Sensation intact in the median/radial/ulnar nerve distributions and symmetric.  Warm, well-perfused hand with brisk capillary refill.       Diagnostic Tests  Right UE EMG studies on 08/06/2019 revealed:  1.  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  Ultrasound findings are within normal limits.  2.  There is no electrodiagnostic evidence for neuropathy in the right upper limb.    X-rays of the right hand on 07/05/2019 revealed Mild generalized osteopenia is present. No acute fracture or dislocation or significant arthritic changes are seen. Negative ulnar variance is present.    Impression   Mild right carpal tunnel syndrome  Right wrist extensor tenosynovitis  Right palm Dupuytren's disease, no contractures    Plan   No improvement following cortisone injection in the right carpal tunnel 2 months ago.  She is wearing night splints which  helps a little bit.  Dr. Theodoro Doing reviewed option of surgery.  She does not feel her symptoms are bad enough to proceed with surgery right now.  She will monitor her symptoms.  Symptoms of extensor tenosynovitis are stable.  Minimal pain, does not bother her that much.  She will continue to monitor.  New diagnosis of Dupuytren's disease right palm with no contractures.  Dr. Theodoro Doing reviewed the etiology and course of Dupuytren's disease.  She is not aware of any family history of hand contractures.  She will continue to monitor.  Return in 3 months to evaluate carpal tunnel symptoms and determine if she wants to proceed with surgery.    We will also evaluate her Dupuytren's  disease.  If no progression of any symptoms, can then follow-up as needed.  All questions answered to her satisfaction.

## 2020-01-30 ENCOUNTER — Ambulatory Visit: Payer: No Typology Code available for payment source | Admitting: Orthopedic Surgery

## 2020-03-14 ENCOUNTER — Other Ambulatory Visit
Admission: RE | Admit: 2020-03-14 | Discharge: 2020-03-14 | Disposition: A | Discharge status: Home / Self Care | Payer: No Typology Code available for payment source | Source: Ambulatory Visit | Attending: Family Medicine | Admitting: Family Medicine

## 2020-03-14 DIAGNOSIS — E039 Hypothyroidism, unspecified: Secondary | ICD-10-CM | POA: Insufficient documentation

## 2020-03-14 LAB — CBC
Hematocrit: 49 % — ABNORMAL HIGH (ref 34–45)
Hemoglobin: 16.5 g/dL — ABNORMAL HIGH (ref 11.2–15.7)
MCH: 33 pg — ABNORMAL HIGH (ref 26–32)
MCHC: 34 g/dL (ref 32–36)
MCV: 97 fL — ABNORMAL HIGH (ref 79–95)
Platelets: 296 10*3/uL (ref 160–370)
RBC: 5 MIL/uL (ref 3.9–5.2)
RDW: 13.9 % (ref 11.7–14.4)
WBC: 9 10*3/uL (ref 4.0–10.0)

## 2020-03-14 LAB — TSH: TSH: 0.59 u[IU]/mL (ref 0.27–4.20)

## 2020-03-14 LAB — LIPID PANEL
Chol/HDL Ratio: 3.1
Cholesterol: 189 mg/dL
HDL: 61 mg/dL — ABNORMAL HIGH (ref 40–60)
LDL Calculated: 101 mg/dL
Non HDL Cholesterol: 128 mg/dL
Triglycerides: 137 mg/dL

## 2020-03-14 LAB — BASIC METABOLIC PANEL
Anion Gap: 14 (ref 7–16)
CO2: 24 mmol/L (ref 20–28)
Calcium: 10 mg/dL (ref 8.6–10.2)
Chloride: 103 mmol/L (ref 96–108)
Creatinine: 0.91 mg/dL (ref 0.51–0.95)
GFR,Black: 78 *
GFR,Caucasian: 68 *
Glucose: 96 mg/dL (ref 60–99)
Lab: 11 mg/dL (ref 6–20)
Potassium: 3.9 mmol/L (ref 3.4–4.7)
Sodium: 141 mmol/L (ref 133–145)

## 2020-03-16 LAB — HEMOGLOBIN A1C: Hemoglobin A1C: 5.9 % — ABNORMAL HIGH

## 2020-04-29 ENCOUNTER — Other Ambulatory Visit: Payer: Self-pay | Admitting: Critical Care Medicine

## 2020-04-29 ENCOUNTER — Other Ambulatory Visit: Payer: BC Managed Care – PPO

## 2020-04-29 DIAGNOSIS — Z20822 Contact with and (suspected) exposure to covid-19: Secondary | ICD-10-CM

## 2020-05-02 LAB — NOVEL CORONAVIRUS, NAA: SARS-CoV-2, NAA: NOT DETECTED

## 2020-05-06 ENCOUNTER — Other Ambulatory Visit: Payer: BC Managed Care – PPO

## 2020-05-06 ENCOUNTER — Other Ambulatory Visit: Payer: Self-pay

## 2020-05-06 DIAGNOSIS — Z20822 Contact with and (suspected) exposure to covid-19: Secondary | ICD-10-CM

## 2020-05-08 LAB — NOVEL CORONAVIRUS, NAA: SARS-CoV-2, NAA: NOT DETECTED

## 2020-05-08 LAB — SARS-COV-2, NAA 2 DAY TAT

## 2020-05-21 ENCOUNTER — Ambulatory Visit: Payer: BC Managed Care – PPO | Admitting: Cardiovascular Disease

## 2020-05-21 ENCOUNTER — Encounter: Payer: Self-pay | Admitting: Cardiovascular Disease

## 2020-05-21 ENCOUNTER — Other Ambulatory Visit: Payer: Self-pay

## 2020-05-21 ENCOUNTER — Encounter: Payer: Self-pay | Admitting: *Deleted

## 2020-05-21 VITALS — BP 130/91 | HR 81 | Temp 96.8°F | Ht 62.0 in | Wt 255.4 lb

## 2020-05-21 DIAGNOSIS — R600 Localized edema: Secondary | ICD-10-CM

## 2020-05-21 DIAGNOSIS — R002 Palpitations: Secondary | ICD-10-CM | POA: Insufficient documentation

## 2020-05-21 DIAGNOSIS — R0602 Shortness of breath: Secondary | ICD-10-CM | POA: Diagnosis not present

## 2020-05-21 DIAGNOSIS — Z5181 Encounter for therapeutic drug level monitoring: Secondary | ICD-10-CM

## 2020-05-21 DIAGNOSIS — R609 Edema, unspecified: Secondary | ICD-10-CM

## 2020-05-21 DIAGNOSIS — I10 Essential (primary) hypertension: Secondary | ICD-10-CM

## 2020-05-21 HISTORY — DX: Localized edema: R60.0

## 2020-05-21 HISTORY — DX: Palpitations: R00.2

## 2020-05-21 MED ORDER — IRBESARTAN 75 MG PO TABS: 75.0000 mg | ORAL_TABLET | Freq: Every day | ORAL | 3 refills | Status: DC

## 2020-05-21 NOTE — Progress Notes (Signed)
Patient ID: Karina Ayers, female   DOB: [birth year], 64 y.o.   MRN: [id number] Patient enrolled for [employer] to ship a 14 day cardiac event monitor to her home.

## 2020-05-21 NOTE — Progress Notes (Signed)
Cardiology Office Note   Date:  05/21/2020   ID:  Karina Ayers, DOB 1956/03/29, MRN [id number]  PCP:  Pcp, No  Cardiologist:   Chilton Si, MD   No chief complaint on file.   History of Present Illness: Karina Ayers is a 64 y.o. female with hypertension, obesity status post gastric sleeve 11/2014, and OSA on CPAP who is being seen today for the evaluation of palpitations.  At the request of Karina Palmer, MD.  She saw Dr. Paulino Rily on 03/2020 and reported palpitations.  EKG at that time was unremarkable.  Her metoprolol/HCTZ was switched to atenolol/chlorthalidone and she was referred to cardiology for further evaluation.  At that time she was not anemic.  Thyroid function was normal 09/2019.  Electrolytes were normal as well.  She notes episodes of tachycardia followed by bradycardia.  When it occurs she has to gasp for breath and feels lightheaded.  The most recent episode occurred last weekend.    [name] was diagnosed with COVID-19 in January.  Prior to that she had episodes of palpitations but not as frequently.  Since then it has worsened and seemed much worse around May 2021.  It occurs 1-2 times per week and lasts for about a minute.  She has chest discomfort with the palpitations but not necessarily with exertion.  However she does not get much exercise.  She notes the one episode occurred while driving.  She wonders if it could be related to her reflux. The last episode occurred while eating a bag of chips.  She notes that she tires easily.  She has chronic LE edema that is unchanged.  This is why Dr. Paulino Rily switched her to chlorthalidone from HCTZ.  When she checks her BP at home it has been labile ranging up to 158/101.  She thinks her BP is stress related.  On average 140-150.  She doesn't have much caffeine in her diet.  She does not drink alcohol.   Past Medical History:  Diagnosis Date  . Hypertension   . Leg edema 05/21/2020  . Palpitations 05/21/2020    Past Surgical  History:  Procedure Laterality Date  . ABDOMINAL HYSTERECTOMY    . ACHILLES TENDON REPAIR Right 2004  . GASTRIC BYPASS       Current Outpatient Medications  Medication Sig Dispense Refill  . irbesartan (AVAPRO) 75 MG tablet Take 1 tablet (75 mg total) by mouth daily. 90 tablet 3   No current facility-administered medications for this visit.    Allergies:   Cortisone, Neosporin [neomycin-bacitracin zn-polymyx], and Prednisone    Social History:  The patient  reports that she has never smoked. She has never used smokeless tobacco. She reports that she does not drink alcohol and does not use drugs.   Family History:  The patient's family history includes Breast cancer in her maternal grandmother, mother, and sister; CAD in her mother; Dementia in her father; Diabetes in her mother and sister; Diverticulitis in her mother; Hypertension in her father, mother, and sister; Osteoporosis in her father.    ROS:  Please see the history of present illness.   Otherwise, review of systems are positive for insomnia.   All other systems are reviewed and negative.    PHYSICAL EXAM: VS:  BP (!) 130/91   Pulse 81   Temp (!) 96.8 F (36 C)   Ht 5\' 2"  (1.575 m)   Wt 255 lb 6.4 oz (115.8 kg)   SpO2 96%   BMI  46.71 kg/m  , BMI Body mass index is 46.71 kg/m. GENERAL:  Well appearing HEENT:  Pupils equal round and reactive, fundi not visualized, oral mucosa unremarkable NECK:  No jugular venous distention, waveform within normal limits, carotid upstroke brisk and symmetric, no bruits, no thyromegaly LYMPHATICS:  No cervical adenopathy LUNGS:  Clear to auscultation bilaterally HEART:  RRR.  PMI not displaced or sustained,S1 and S2 within normal limits, no S3, no S4, no clicks, no rubs, no murmurs ABD:  Flat, positive bowel sounds normal in frequency in pitch, no bruits, no rebound, no guarding, no midline pulsatile mass, no hepatomegaly, no splenomegaly EXT:  2 plus pulses throughout, no edema, no  cyanosis no clubbing SKIN:  No rashes no nodules NEURO:  Cranial nerves II through XII grossly intact, motor grossly intact throughout PSYCH:  Cognitively intact, oriented to person place and time   EKG:  EKG is ordered today. The ekg ordered today demonstrates sinus rhythm.  Rate 81 bpm.  Nonspecific ST/T changes.   Recent Labs: No results found for requested labs within last 8760 hours.    Lipid Panel    Component Value Date/Time   CHOL 146 08/31/2006 2031   TRIG 156 (H) 08/31/2006 2031   HDL 51 08/31/2006 2031   CHOLHDL 2.9 Ratio 08/31/2006 2031   VLDL 31 08/31/2006 2031   LDLCALC 64 08/31/2006 2031      Wt Readings from Last 3 Encounters:  05/21/20 255 lb 6.4 oz (115.8 kg)  04/14/11 253 lb (114.8 kg)      ASSESSMENT AND PLAN:  # Palpitations:  Etiology unclear.  We wil get a 14 day monitor.  She already had blood counts thyroid function, and electrolytes with Dr. Paulino Rily and these labs were unremarkable.  The episodes seem to short to be atrial fibrillation.  They are not exertional in nature.  # Hypertension:  BP poorly controlled on atenolol/chlorthalidone.  She didn't tolerate amlodipine in the past.  We will add irbesartan 75 mg daily.  Check a basic metabolic panel in a week.  She will check her blood pressures at home.  Her goal is less than 130/80.  # LE Edema:  # Shortness of breath: We will get an echo to evaluate for heart failure.  She is only mildly volume overloaded on exam today.  Current medicines are reviewed at length with the patient today.  The patient does not have concerns regarding medicines.  The following changes have been made: Start irbesartan  Labs/ tests ordered today include:   Orders Placed This Encounter  Procedures  . Basic metabolic panel  . CARDIAC EVENT MONITOR  . EKG 12-Lead  . ECHOCARDIOGRAM COMPLETE     Disposition:   FU with      Signed, Megahn Killings C. Duke Salvia, MD, [facility]  05/21/2020 1:40 PM    [facility]

## 2020-05-21 NOTE — Patient Instructions (Signed)
Medication Instructions:  START IRBESARTAN 75 MG DAILY   *If you need a refill on your cardiac medications before your next appointment, please call your pharmacy*  Lab Work: BMET IN 1 WEEK   If you have labs (blood work) drawn today and your tests are completely normal, you will receive your results only by:  MyChart Message (if you have MyChart) OR  A paper copy in the mail If you have any lab test that is abnormal or we need to change your treatment, we will call you to review the results.  Testing/Procedures: Your physician has requested that you have an echocardiogram. Echocardiography is a painless test that uses sound waves to create images of your heart. It provides your doctor with information about the size and shape of your heart and how well your hearts chambers and valves are working. This procedure takes approximately one hour. There are no restrictions for this procedure.  [facility] AT [address]   Your physician has recommended that you wear an event monitor. Event monitors are medical devices that record the hearts electrical activity. Doctors most often [place] these monitors to diagnose arrhythmias. Arrhythmias are problems with the speed or rhythm of the heartbeat. The monitor is a small, portable device. You can wear one while you do your normal daily activities. This is usually used to diagnose what is causing palpitations/syncope (passing out). 14 DAY  THIS WILL BE MAILED TO YOU   Follow-Up: At [facility], you and your health needs are our priority.  As part of our continuing mission to provide you with exceptional heart care, we have created designated Provider Care Teams.  These Care Teams include your primary Cardiologist (physician) and Advanced Practice Providers (APPs -  Physician Assistants and Nurse Practitioners) who all work together to provide you with the care you need, when you need it.  We recommend signing up for the patient portal  called "MyChart".  Sign up information is provided on this After Visit Summary.  MyChart is used to connect with patients for Virtual Visits (Telemedicine).  Patients are able to view lab/test results, encounter notes, upcoming appointments, etc.  Non-urgent messages can be sent to your provider as well.   To learn more about what you can do with MyChart, go to [email or web address].    Your next appointment:   4-6 week(s)  The format for your next appointment:   In Person  Provider:   You may see DR [facility]  or one of the following Advanced Practice Providers on your designated Care Team:    Corine Shelter, PA-C  [place], [place]  Edd Fabian, [place]  Other Instructions  [employer] Cardiac Event Monitor Instructions Your physician has requested you wear your cardiac event monitor for __14___ days, (1-30). [employer] may call or text to confirm a shipping address. The monitor will be sent to a land address via UPS. [employer] will not ship a monitor to a PO BOX. It typically takes 3-5 days to receive your monitor after it has been enrolled. [employer] will assist with USPS tracking if your package is delayed. The telephone number for [employer] is [phone]. Once you have received your monitor, please review the enclosed instructions. Instruction tutorials can also be viewed under help and settings on the enclosed cell phone. Your monitor has already been registered assigning a specific monitor serial # to you.  Applying the monitor Remove cell phone from case and turn it on. The cell phone works as your  transmitter and needs to be within 10 feet of you at all times. The cell phone will need to be charged on a daily basis. We recommend you plug the cell phone into the enclosed charger at your bedside table every night.  Monitor batteries: You will receive two monitor batteries labelled #1 and #2. These are your recorders. Plug battery #2 onto the second connection on the  enclosed charger. Keep one battery on the charger at all times. This will keep the monitor battery deactivated. It will also keep it fully charged for when you need to switch your monitor batteries. A small light will be blinking on the battery emblem when it is charging. The light on the battery emblem will remain on when the battery is fully charged.  Open package of a Monitor strip. Insert battery #1 into black hood on strip and gently squeeze monitor battery onto connection as indicated in instruction booklet. Set aside while preparing skin.  Choose location for your strip, vertical or horizontal, as indicated in the instruction booklet. Shave to remove all hair from location. There cannot be any lotions, oils, powders, or colognes on skin where monitor is to be applied. Wipe skin clean with enclosed Saline wipe. Dry skin completely.  Peel paper labeled #1 off the back of the Monitor strip exposing the adhesive. Place the monitor on the chest in the vertical or horizontal position shown in the instruction booklet. One arrow on the monitor strip must be pointing upward. Carefully remove paper labeled #2, attaching remainder of strip to your skin. Try not to create any folds or wrinkles in the strip as you apply it.  Firmly press and release the circle in the center of the monitor battery. You will hear a small beep. This is turning the monitor battery on. The heart emblem on the monitor battery will light up every 5 seconds if the monitor battery in turned on and connected to the patient securely. Do not push and hold the circle down as this turns the monitor battery off. The cell phone will locate the monitor battery. A screen will appear on the cell phone checking the connection of your monitor strip. This may read poor connection initially but change to good connection within the next minute. Once your monitor accepts the connection you will hear a series of 3 beeps followed by a  climbing crescendo of beeps. A screen will appear on the cell phone showing the two monitor strip placement options. Touch the picture that demonstrates where you applied the monitor strip.  Your monitor strip and battery are waterproof. You are able to shower, bathe, or swim with the monitor on. They just ask you do not submerge deeper than 3 feet underwater. We recommend removing the monitor if you are swimming in a lake, river, or ocean.  Your monitor battery will need to be switched to a fully charged monitor battery approximately once a week. The cell phone will alert you of an action which needs to be made.  On the cell phone, tap for details to reveal connection status, monitor battery status, and cell phone battery status. The green dots indicates your monitor is in good status. A red dot indicates there is something that needs your attention.  To record a symptom, click the circle on the monitor battery. In 30-60 seconds a list of symptoms will appear on the cell phone. Select your symptom and tap save. Your monitor will record a sustained or significant arrhythmia regardless of you  clicking the button. Some patients do not feel the heart rhythm irregularities. [employer] will notify us of any serious or critical events.  Refer to instruction booklet for instructions on switching batteries, changing strips, the Do not disturb or Pause features, or any additional questions.  Call [employer] at [phone], to confirm your monitor is transmitting and record your baseline. They will answer any questions you may have regarding the monitor instructions at that time.  Returning the monitor to [employer] Place all equipment back into blue box. Peel off strip of paper to expose adhesive and close box securely. There is a prepaid UPS shipping label on this box. Drop in a UPS drop box, or at a UPS facility like [employer]. You may also contact [employer] to arrange UPS to pick up monitor  package at your home.

## 2020-05-27 ENCOUNTER — Other Ambulatory Visit: Payer: Self-pay | Admitting: Family Medicine

## 2020-05-27 DIAGNOSIS — F17210 Nicotine dependence, cigarettes, uncomplicated: Secondary | ICD-10-CM

## 2020-05-28 ENCOUNTER — Ambulatory Visit (INDEPENDENT_AMBULATORY_CARE_PROVIDER_SITE_OTHER): Payer: BC Managed Care – PPO

## 2020-05-28 DIAGNOSIS — R002 Palpitations: Secondary | ICD-10-CM

## 2020-05-28 DIAGNOSIS — R0602 Shortness of breath: Secondary | ICD-10-CM | POA: Diagnosis not present

## 2020-06-02 ENCOUNTER — Ambulatory Visit
Admission: RE | Admit: 2020-06-02 | Discharge: 2020-06-02 | Disposition: A | Discharge status: Home / Self Care | Payer: No Typology Code available for payment source | Source: Ambulatory Visit | Attending: Family Medicine | Admitting: Family Medicine

## 2020-06-02 DIAGNOSIS — R918 Other nonspecific abnormal finding of lung field: Secondary | ICD-10-CM | POA: Insufficient documentation

## 2020-06-02 DIAGNOSIS — F17210 Nicotine dependence, cigarettes, uncomplicated: Secondary | ICD-10-CM | POA: Insufficient documentation

## 2020-06-02 DIAGNOSIS — Z122 Encounter for screening for malignant neoplasm of respiratory organs: Secondary | ICD-10-CM | POA: Insufficient documentation

## 2020-06-04 ENCOUNTER — Ambulatory Visit: Payer: No Typology Code available for payment source | Admitting: Orthopedic Surgery

## 2020-06-09 ENCOUNTER — Other Ambulatory Visit: Payer: Self-pay

## 2020-06-09 ENCOUNTER — Ambulatory Visit (HOSPITAL_COMMUNITY): Payer: BC Managed Care – PPO | Attending: Cardiology

## 2020-06-09 DIAGNOSIS — R609 Edema, unspecified: Secondary | ICD-10-CM | POA: Insufficient documentation

## 2020-06-09 DIAGNOSIS — R002 Palpitations: Secondary | ICD-10-CM | POA: Diagnosis not present

## 2020-06-09 DIAGNOSIS — R0602 Shortness of breath: Secondary | ICD-10-CM | POA: Diagnosis not present

## 2020-06-09 LAB — ECHOCARDIOGRAM COMPLETE
Area-P 1/2: 3.99 cm2
S' Lateral: 2.7 cm

## 2020-07-02 ENCOUNTER — Encounter: Payer: Self-pay | Admitting: Cardiovascular Disease

## 2020-07-02 ENCOUNTER — Ambulatory Visit: Payer: BC Managed Care – PPO | Admitting: Cardiovascular Disease

## 2020-07-02 ENCOUNTER — Other Ambulatory Visit: Payer: Self-pay

## 2020-07-02 VITALS — BP 140/93 | HR 88 | Temp 97.3°F | Ht 62.0 in | Wt 255.2 lb

## 2020-07-02 DIAGNOSIS — I493 Ventricular premature depolarization: Secondary | ICD-10-CM | POA: Diagnosis not present

## 2020-07-02 DIAGNOSIS — I10 Essential (primary) hypertension: Secondary | ICD-10-CM

## 2020-07-02 DIAGNOSIS — Z5181 Encounter for therapeutic drug level monitoring: Secondary | ICD-10-CM | POA: Diagnosis not present

## 2020-07-02 MED ORDER — CHLORTHALIDONE 25 MG PO TABS: 25.0000 mg | ORAL_TABLET | Freq: Every day | ORAL | 3 refills | Status: DC

## 2020-07-02 NOTE — Progress Notes (Signed)
Cardiology Office Note   Date:  07/22/2020   ID:  Karina Ayers, DOB 08/07/1956, MRN [id number]  PCP:  Pcp, No  Cardiologist:   Karina Si, MD   No chief complaint on file.   History of Present Illness: Karina Ayers is a 65 y.o. female with PVCs, hypertension, obesity status post gastric sleeve 11/2014, and OSA on CPAP here for follow-up.  She was initially seen 04/2020 for palpitations.  She saw Dr. Paulino Ayers on 03/2020 and reported palpitations.  EKG at that time was unremarkable.  Her metoprolol/HCTZ was switched to atenolol/chlorthalidone and she was referred to cardiology for further evaluation.  At her appointment she was referred for 14-day event monitor that showed occasional PVCs and up to 4 beat runs of NSVT.   Thyroid function was normal 09/2019.  Electrolytes were normal as well.  She notes episodes of tachycardia followed by bradycardia.  Karina Ayers was diagnosed with COVID-19 in January.  Prior to that she had episodes of palpitations but not as frequently.  She was referred for an echo 05/2020 that revealed LVEF 55 to 60% with grade 1 diastolic dysfunction.  It was otherwise unremarkable.  Her blood pressure was poorly controlled and irbesartan was added to her regimen.  She wore an ambulatory monitor 05/2020 that showed up to 4 beat runs of NSVT and occasional PVCs.  Since her last appointment she continues to have palpitations from time to time.  She has been walking some for exercise when the weather is good.  She typically walks at lunchtime.  She walks for 20-30 minutes.  She gets tired and feels sluggish with exercise but has no chest pain.  Her breathing is stable.  She has no lower extremity edema, orthopnea, or PND.  She occasionally gets pain in her right leg.  She denies claudication.  She does not check her blood pressure at home often.   Past Medical History:  Diagnosis Date  . Hypertension   . Leg edema 05/21/2020  . Palpitations 05/21/2020  . PVC's (premature  ventricular contractions) 07/22/2020    Past Surgical History:  Procedure Laterality Date  . ABDOMINAL HYSTERECTOMY    . ACHILLES TENDON REPAIR Right 2004  . GASTRIC BYPASS       Current Outpatient Medications  Medication Sig Dispense Refill  . chlorthalidone (HYGROTON) 25 MG tablet Take 1 tablet (25 mg total) by mouth daily. 90 tablet 3  . irbesartan (AVAPRO) 75 MG tablet Take 1 tablet (75 mg total) by mouth daily. 90 tablet 3   No current facility-administered medications for this visit.    Allergies:   Cortisone, Neosporin [neomycin-bacitracin zn-polymyx], and Prednisone    Social History:  The patient  reports that she has never smoked. She has never used smokeless tobacco. She reports that she does not drink alcohol and does not use drugs.   Family History:  The patient's family history includes Breast cancer in her maternal grandmother, mother, and sister; CAD in her mother; Dementia in her father; Diabetes in her mother and sister; Diverticulitis in her mother; Hypertension in her father, mother, and sister; Osteoporosis in her father.    ROS:  Please see the history of present illness.   Otherwise, review of systems are positive for insomnia.   All other systems are reviewed and negative.    PHYSICAL EXAM: VS:  BP (!) 140/93   Pulse 88   Temp (!) 97.3 F (36.3 C)   Ht 5\' 2"  (1.575 m)  Wt 255 lb 3.2 oz (115.8 kg)   SpO2 96%   BMI 46.68 kg/m  , BMI Body mass index is 46.68 kg/m. GENERAL:  Well appearing HEENT:  Pupils equal round and reactive, fundi not visualized, oral mucosa unremarkable NECK:  No jugular venous distention, waveform within normal limits, carotid upstroke brisk and symmetric, no bruits LUNGS:  Clear to auscultation bilaterally HEART:  RRR.  PMI not displaced or sustained,S1 and S2 within normal limits, no S3, no S4, no clicks, no rubs, no murmurs ABD:  Flat, positive bowel sounds normal in frequency in pitch, no bruits, no rebound, no guarding,  no midline pulsatile mass, no hepatomegaly, no splenomegaly EXT:  2 plus pulses throughout, trace edema, no cyanosis no clubbing SKIN:  No rashes no nodules NEURO:  Cranial nerves II through XII grossly intact, motor grossly intact throughout PSYCH:  Cognitively intact, oriented to person place and time   EKG:  EKG is ordered today. The ekg ordered today demonstrates sinus rhythm.  Rate 81 bpm.  Nonspecific ST/T changes. 07/02/2020: Sinus rhythm.  Rate 85 bpm.  rSR'  14 Day Event Monitor 05/2020:  Quality: Fair.  Baseline artifact. Predominant rhythm: sinus rhythm Average heart rate: 81 bpm Max heart rate: 141 bpm Min heart rate: 57 bpm Pauses >2.5 seconds: none  Occasional PVCs Up to 4 beat runs of NSVT  Echo 06/09/20: 1. Left ventricular ejection fraction, by estimation, is 55 to 60%. The  left ventricle has normal function. The left ventricle has no regional  wall motion abnormalities. Left ventricular diastolic parameters are  consistent with Grade I diastolic  dysfunction (impaired relaxation).  2. Right ventricular systolic function is normal. The right ventricular  size is normal. There is normal pulmonary artery systolic pressure. The  estimated right ventricular systolic pressure is 22.9 mmHg.  3. The mitral valve is normal in structure. No evidence of mitral valve  regurgitation. No evidence of mitral stenosis.  4. The aortic valve is tricuspid. Aortic valve regurgitation is trivial.  No aortic stenosis is present.  5. The inferior vena cava is normal in size with greater than 50%  respiratory variability, suggesting right atrial pressure of 3 mmHg.    Recent Labs: 07/16/2020: BUN 16; Creatinine, Ser 1.09; Potassium 4.0; Sodium 141    Lipid Panel    Component Value Date/Time   CHOL 146 08/31/2006 2031   TRIG 156 (H) 08/31/2006 2031   HDL 51 08/31/2006 2031   CHOLHDL 2.9 Ratio 08/31/2006 2031   VLDL 31 08/31/2006 2031   LDLCALC 64 08/31/2006 2031       Wt Readings from Last 3 Encounters:  07/02/20 255 lb 3.2 oz (115.8 kg)  05/21/20 255 lb 6.4 oz (115.8 kg)  04/14/11 253 lb (114.8 kg)      ASSESSMENT AND PLAN:  # NSVT:  Karina Ayers wore a monitor that showed NSVT and PVCs.  She exercises regularly and has no ischemic symptoms.  She is not bothered by the palpitations.  Consider adding a beta-blocker.  There is no evidence of structural heart disease on her echo 05/2020.  # Hypertension:  BP was poorly controlled on atenolol/chlorthalidone.  She didn't tolerate amlodipine in the past.  Continue irbesartan and add chlorthalidone 25 mg daily.  Check a basic metabolic panel in a week.  Consider adding back a beta-blocker as above.  # LE Edema:  # Shortness of breath:  Symptoms improving with more exercise.  Adding back her diuretic as above.  Current medicines are reviewed  at length with the patient today.  The patient does not have concerns regarding medicines.  The following changes have been made: Start irbesartan  Labs/ tests ordered today include:   Orders Placed This Encounter  Procedures  . Basic metabolic panel     Disposition:   FU with      Signed, Tran Arzuaga C. Duke Salvia, MD, [facility]  07/22/2020 7:54 AM    [facility]

## 2020-07-02 NOTE — Patient Instructions (Signed)
Medication Instructions:  START CHLORTHALIDONE 25 MG DAILY   *If you need a refill on your cardiac medications before your next appointment, please call your pharmacy*  Lab Work: Wednesday BEFORE THANKSGIVING   If you have labs (blood work) drawn today and your tests are completely normal, you will receive your results only by: Marland Kitchen MyChart Message (if you have MyChart) OR . A paper copy in the mail If you have any lab test that is abnormal or we need to change your treatment, we will call you to review the results.  Testing/Procedures: NONE  Follow-Up: At [facility], you and your health needs are our priority.  As part of our continuing mission to provide you with exceptional heart care, we have created designated Provider Care Teams.  These Care Teams include your primary Cardiologist (physician) and Advanced Practice Providers (APPs -  Physician Assistants and Nurse Practitioners) who all work together to provide you with the care you need, when you need it.  We recommend signing up for the patient portal called "MyChart".  Sign up information is provided on this After Visit Summary.  MyChart is used to connect with patients for Virtual Visits (Telemedicine).  Patients are able to view lab/test results, encounter notes, upcoming appointments, etc.  Non-urgent messages can be sent to your provider as well.   To learn more about what you can do with MyChart, go to [email or web address].    Your next appointment:   2 month(s)  The format for your next appointment:   In Person  Provider:   You may see DR [facility] or one of the following Advanced Practice Providers on your designated Care Team:    Corine Shelter, PA-C  [place], [place]  Edd Fabian, [place]

## 2020-07-17 LAB — BASIC METABOLIC PANEL
BUN/Creatinine Ratio: 15 (ref 12–28)
BUN: 16 mg/dL (ref 8–27)
CO2: 27 mmol/L (ref 20–29)
Calcium: 9.2 mg/dL (ref 8.7–10.3)
Chloride: 100 mmol/L (ref 96–106)
Creatinine, Ser: 1.09 mg/dL — ABNORMAL HIGH (ref 0.57–1.00)
GFR calc Af Amer: 63 mL/min/{1.73_m2} (ref >59)
GFR calc non Af Amer: 55 mL/min/{1.73_m2} — ABNORMAL LOW (ref >59)
Glucose: 107 mg/dL — ABNORMAL HIGH (ref 65–99)
Potassium: 4 mmol/L (ref 3.5–5.2)
Sodium: 141 mmol/L (ref 134–144)

## 2020-07-22 ENCOUNTER — Encounter: Payer: Self-pay | Admitting: Cardiovascular Disease

## 2020-07-22 DIAGNOSIS — I493 Ventricular premature depolarization: Secondary | ICD-10-CM

## 2020-07-22 HISTORY — DX: Ventricular premature depolarization: I49.3

## 2020-08-25 ENCOUNTER — Ambulatory Visit: Payer: BC Managed Care – PPO | Admitting: Cardiology

## 2020-10-07 ENCOUNTER — Ambulatory Visit
Admission: EM | Admit: 2020-10-07 | Discharge: 2020-10-07 | Disposition: A | Discharge status: Home / Self Care | Payer: BC Managed Care – PPO | Attending: Emergency Medicine | Admitting: Emergency Medicine

## 2020-10-07 ENCOUNTER — Ambulatory Visit (INDEPENDENT_AMBULATORY_CARE_PROVIDER_SITE_OTHER): Payer: BC Managed Care – PPO

## 2020-10-07 ENCOUNTER — Other Ambulatory Visit: Payer: Self-pay

## 2020-10-07 ENCOUNTER — Encounter: Payer: Self-pay | Admitting: Emergency Medicine

## 2020-10-07 DIAGNOSIS — J22 Unspecified acute lower respiratory infection: Secondary | ICD-10-CM | POA: Diagnosis not present

## 2020-10-07 DIAGNOSIS — R059 Cough, unspecified: Secondary | ICD-10-CM

## 2020-10-07 MED ORDER — DM-GUAIFENESIN ER 30-600 MG PO TB12: 1.0000 | ORAL_TABLET | Freq: Two times a day (BID) | ORAL | 0 refills | Status: DC

## 2020-10-07 MED ORDER — BENZONATATE 200 MG PO CAPS: 200.0000 mg | ORAL_CAPSULE | Freq: Three times a day (TID) | ORAL | 0 refills | Status: AC | PRN

## 2020-10-07 MED ORDER — DOXYCYCLINE HYCLATE 100 MG PO CAPS: 100.0000 mg | ORAL_CAPSULE | Freq: Two times a day (BID) | ORAL | 0 refills | Status: AC

## 2020-10-07 MED ORDER — METHYLPREDNISOLONE 4 MG PO TBPK: ORAL_TABLET | ORAL | 0 refills | Status: DC

## 2020-10-07 MED ORDER — ALBUTEROL SULFATE HFA 108 (90 BASE) MCG/ACT IN AERS: 1.0000 | INHALATION_SPRAY | Freq: Four times a day (QID) | RESPIRATORY_TRACT | 0 refills | Status: DC | PRN

## 2020-10-07 NOTE — ED Provider Notes (Signed)
[facility]    CSN: [id number] Arrival date & time: 10/07/20  1055      History   Chief Complaint Chief Complaint  Patient presents with  . Cough    HPI Karina Ayers is a 65 y.o. female history of hypertension, presenting today for evaluation of cough and headache.  Reports persistent cough for the past 2 weeks.  Feels as if she needs to bring mucus up, but cough has been relatively dry.  Initially with sinus symptoms, but this is improved.  Denies fevers.  Prior Covid testing negative.  Denies history of lung problems, tobacco use.  Prescribed cough medicine from PCP, possibly Phenergan DM which did help some, but cough has worsened recently again.  Allergies clarified with patient-reports allergy to cortisone cream causing rash, denies allergy to prednisone.  She is unsure if she has ever taken any oral steroids.  HPI  Past Medical History:  Diagnosis Date  . Hypertension   . Leg edema 05/21/2020  . Palpitations 05/21/2020  . PVC's (premature ventricular contractions) 07/22/2020    Patient Active Problem List   Diagnosis Date Noted  . PVC's (premature ventricular contractions) 07/22/2020  . Palpitations 05/21/2020  . Leg edema 05/21/2020  . Shoulder pain 04/15/2011  . HYPERTENSION, BENIGN ESSENTIAL 11/01/2006  . ALLERGIC RHINITIS WITH CONJUNCTIVITIS 11/01/2006  . OBESITY, NOS 10/20/2006  . DEPRESSION, MAJOR, RECURRENT 10/20/2006  . DEPRESSIVE DISORDER, NOS 10/20/2006  . THROMBOPHLEBITIS, DEEP LEG 10/20/2006  . ENDOMETRIOSIS 10/20/2006    Past Surgical History:  Procedure Laterality Date  . ABDOMINAL HYSTERECTOMY    . ACHILLES TENDON REPAIR Right 2004  . GASTRIC BYPASS      OB History   No obstetric history on file.      Home Medications    Prior to Admission medications   Medication Sig Start Date End Date Taking? Authorizing Provider  albuterol (VENTOLIN HFA) 108 (90 Base) MCG/ACT inhaler Inhale 1-2 puffs into the lungs every 6 (six) hours  as needed for wheezing or shortness of breath. 10/07/20  Yes Mehek Grega C, PA-C  benzonatate (TESSALON) 200 MG capsule Take 1 capsule (200 mg total) by mouth 3 (three) times daily as needed for up to 7 days for cough. 10/07/20 10/14/20 Yes Kelsea Mousel C, PA-C  dextromethorphan-guaiFENesin (MUCINEX DM) 30-600 MG 12hr tablet Take 1 tablet by mouth 2 (two) times daily. 10/07/20  Yes Wilhelmenia Addis C, PA-C  doxycycline (VIBRAMYCIN) 100 MG capsule Take 1 capsule (100 mg total) by mouth 2 (two) times daily for 7 days. 10/07/20 10/14/20 Yes Kc Sedlak C, PA-C  methylPREDNISolone (MEDROL DOSEPAK) 4 MG TBPK tablet Take as directed 10/07/20  Yes Jrake Rodriquez C, PA-C  chlorthalidone (HYGROTON) 25 MG tablet Take 1 tablet (25 mg total) by mouth daily. 07/02/20 09/30/20  Chilton Si, MD  irbesartan (AVAPRO) 75 MG tablet Take 1 tablet (75 mg total) by mouth daily. 05/21/20   Chilton Si, MD    Family History Family History  Problem Relation Age of Onset  . Hypertension Mother   . Diabetes Mother   . Diverticulitis Mother   . CAD Mother   . Breast cancer Mother   . Hypertension Father   . Dementia Father   . Osteoporosis Father   . Breast cancer Maternal Grandmother   . Breast cancer Sister   . Hypertension Sister   . Diabetes Sister     Social History Social History   Tobacco Use  . Smoking status: Never Smoker  . Smokeless tobacco:  Never Used  Substance Use Topics  . Alcohol use: No  . Drug use: No     Allergies   Cortisone, Neosporin [neomycin-bacitracin zn-polymyx], and Prednisone   Review of Systems Review of Systems  Constitutional: Negative for activity change, appetite change, chills, fatigue and fever.  HENT: Negative for congestion, ear pain, rhinorrhea, sinus pressure, sore throat and trouble swallowing.   Eyes: Negative for discharge and redness.  Respiratory: Positive for cough. Negative for chest tightness and shortness of breath.   Cardiovascular:  Negative for chest pain.  Gastrointestinal: Negative for abdominal pain, diarrhea, nausea and vomiting.  Musculoskeletal: Negative for myalgias.  Skin: Negative for rash.  Neurological: Positive for headaches. Negative for dizziness and light-headedness.     Physical Exam Triage Vital Signs ED Triage Vitals  Enc Vitals Group     BP      Pulse      Resp      Temp      Temp src      SpO2      Weight      Height      Head Circumference      Peak Flow      Pain Score      Pain Loc      Pain Edu?      Excl. in GC?    No data found.  Updated Vital Signs BP (!) 186/83 (BP Location: Left Arm)   Pulse 98   Temp 98.1 F (36.7 C) (Oral)   Resp 18   SpO2 95%   Visual Acuity Right Eye Distance:   Left Eye Distance:   Bilateral Distance:    Right Eye Near:   Left Eye Near:    Bilateral Near:     Physical Exam Vitals and nursing note reviewed.  Constitutional:      Appearance: She is well-developed and well-nourished.     Comments: No acute distress  HENT:     Head: Normocephalic and atraumatic.     Ears:     Comments: Bilateral ears without tenderness to palpation of external auricle, tragus and mastoid, EAC's without erythema or swelling, TM's with good bony landmarks and cone of light. Non erythematous.     Nose: Nose normal.     Mouth/Throat:     Comments: Oral mucosa pink and moist, no tonsillar enlargement or exudate. Posterior pharynx patent and nonerythematous, no uvula deviation or swelling. Normal phonation. Eyes:     Conjunctiva/sclera: Conjunctivae normal.  Cardiovascular:     Rate and Rhythm: Normal rate and regular rhythm.  Pulmonary:     Effort: Pulmonary effort is normal. No respiratory distress.     Comments: Frequent coughing, inability to take deep inspiration without cough, bronchospasm present with coughing, no other wheezing or rales noted Abdominal:     General: There is no distension.  Musculoskeletal:        General: Normal range of  motion.     Cervical back: Neck supple.  Skin:    General: Skin is warm and dry.  Neurological:     Mental Status: She is alert and oriented to person, place, and time.  Psychiatric:        Mood and Affect: Mood and affect normal.      UC Treatments / Results  Labs (all labs ordered are listed, but only abnormal results are displayed) Labs Reviewed - No data to display  EKG   Radiology DG Chest 2 View  Result Date: 10/07/2020 CLINICAL DATA:  Dry cough. EXAM: CHEST - 2 VIEW COMPARISON:  No prior. FINDINGS: Mediastinum and hilar structures normal. Borderline cardiomegaly. No pulmonary venous congestion. Low lung volumes with mild bibasilar atelectasis. Mild bibasilar infiltrates cannot be completely excluded. No pleural effusion or pneumothorax. Surgical clips left upper quadrant. IMPRESSION: Low lung volumes with mild bibasilar atelectasis. Mild bibasilar infiltrates cannot be completely excluded. Electronically Signed   By: Maisie Fus  Register   On: 10/07/2020 11:39    Procedures Procedures (including critical care time)  Medications Ordered in UC Medications - No data to display  Initial Impression / Assessment and Plan / UC Course  I have reviewed the triage vital signs and the nursing notes.  Pertinent labs & imaging results that were available during my care of the patient were reviewed by me and considered in my medical decision making (see chart for details).     Lower respiratory infection-chest x-ray with atelectasis versus mild infiltrate, covering with doxycycline, also treating for bronchitis with Medrol Dosepak, advised patient to monitor symptoms with taking this, given reported allergy to topical steroid cream rather than oral steroids feel this is safe to proceed.  Advised patient to stop medicine and call if developing any concerns to prednisone. albuterol inhaler as needed.  Tessalon and Mucinex DM.  Rest and fluids.  Discussed strict return precautions. Patient  verbalized understanding and is agreeable with plan.    Final Clinical Impressions(s) / UC Diagnoses   Final diagnoses:  Lower respiratory infection (e.g., bronchitis, pneumonia, pneumonitis, pulmonitis)     Discharge Instructions     Begin doxycycline twice daily for 1 week Begin Medrol Dosepak as directed Albuterol inhaler 1 to 2 puffs every 4-6 hours as needed for shortness of breath, chest tightness and wheezing Tessalon every 8 hours for cough Pair with Mucinex DM to further loosen congestion in chest Rest and fluids Follow-up if not improving or worsening     ED Prescriptions    Medication Sig Dispense Auth. Provider   doxycycline (VIBRAMYCIN) 100 MG capsule Take 1 capsule (100 mg total) by mouth 2 (two) times daily for 7 days. 14 capsule Mauricia Mertens C, PA-C   methylPREDNISolone (MEDROL DOSEPAK) 4 MG TBPK tablet Take as directed 21 each Allenmichael Mcpartlin C, PA-C   benzonatate (TESSALON) 200 MG capsule Take 1 capsule (200 mg total) by mouth 3 (three) times daily as needed for up to 7 days for cough. 28 capsule Jahyra Sukup C, PA-C   dextromethorphan-guaiFENesin (MUCINEX DM) 30-600 MG 12hr tablet Take 1 tablet by mouth 2 (two) times daily. 20 tablet Keah Lamba C, PA-C   albuterol (VENTOLIN HFA) 108 (90 Base) MCG/ACT inhaler Inhale 1-2 puffs into the lungs every 6 (six) hours as needed for wheezing or shortness of breath. 18 g Sundee Garland, [place] C, PA-C     PDMP not reviewed this encounter.   Lew Dawes, [place] 10/07/20 1154

## 2020-10-07 NOTE — ED Triage Notes (Signed)
Pt sts cough x 2 weeks not improved; seen by PCP and given meds; pt had negative covid test; pt sts some HA and body aches over last few days

## 2020-10-07 NOTE — Discharge Instructions (Addendum)
Begin doxycycline twice daily for 1 week Begin Medrol Dosepak as directed Albuterol inhaler 1 to 2 puffs every 4-6 hours as needed for shortness of breath, chest tightness and wheezing Tessalon every 8 hours for cough Pair with Mucinex DM to further loosen congestion in chest Rest and fluids Follow-up if not improving or worsening

## 2020-10-27 ENCOUNTER — Other Ambulatory Visit
Admission: RE | Admit: 2020-10-27 | Discharge: 2020-10-27 | Disposition: A | Discharge status: Home / Self Care | Payer: No Typology Code available for payment source | Source: Ambulatory Visit | Attending: Family Medicine | Admitting: Family Medicine

## 2020-10-27 DIAGNOSIS — F4310 Post-traumatic stress disorder, unspecified: Secondary | ICD-10-CM | POA: Insufficient documentation

## 2020-10-27 LAB — REGIONAL CHEMICAL DEPEN SCRN, UR
Amphetamine,UR: NEGATIVE
Barbiturate,UR: NEGATIVE
Benzodiazepinen,UR: NEGATIVE
Buprenorphine,Ur: NEGATIVE
Cocaine/Metab,UR: NEGATIVE
Methadone Metab,UR: NEGATIVE
Methamphetamine,Ur: NEGATIVE
Opiates,UR: NEGATIVE
Oxycodone,UR: NEGATIVE
PCP,UR: NEGATIVE
THC Metabolite,UR: NEGATIVE
Tricyclics,UR: NEGATIVE

## 2020-12-11 ENCOUNTER — Other Ambulatory Visit: Payer: Self-pay

## 2020-12-11 ENCOUNTER — Ambulatory Visit
Admission: EM | Admit: 2020-12-11 | Discharge: 2020-12-11 | Disposition: A | Discharge status: Home / Self Care | Payer: BC Managed Care – PPO | Attending: Emergency Medicine | Admitting: Emergency Medicine

## 2020-12-11 DIAGNOSIS — Z20822 Contact with and (suspected) exposure to covid-19: Secondary | ICD-10-CM | POA: Diagnosis not present

## 2020-12-11 DIAGNOSIS — J0100 Acute maxillary sinusitis, unspecified: Secondary | ICD-10-CM | POA: Diagnosis not present

## 2020-12-11 DIAGNOSIS — J209 Acute bronchitis, unspecified: Secondary | ICD-10-CM | POA: Diagnosis not present

## 2020-12-11 MED ORDER — METHYLPREDNISOLONE 4 MG PO TBPK: ORAL_TABLET | ORAL | 0 refills | Status: DC

## 2020-12-11 MED ORDER — BENZONATATE 200 MG PO CAPS: 200.0000 mg | ORAL_CAPSULE | Freq: Three times a day (TID) | ORAL | 0 refills | Status: AC | PRN

## 2020-12-11 MED ORDER — AMOXICILLIN-POT CLAVULANATE 875-125 MG PO TABS: 1.0000 | ORAL_TABLET | Freq: Two times a day (BID) | ORAL | 0 refills | Status: AC

## 2020-12-11 NOTE — ED Triage Notes (Signed)
Pt reports productive cough, and pressure in head radiating down right ear into central chest area. Reports right side ear pain as well.   Reports salivary glands have calcium deposits and do not produce saliva on right side and feels this may be related.

## 2020-12-11 NOTE — Discharge Instructions (Signed)
COVID test pending, monitor MyChart for results Use albuterol inhaler 1 to 2 puffs every 4-6 hours as needed for shortness of breath, chest tightness and wheezing Begin Medrol Dosepak to help with wheezing, sinus inflammation/pressure Tessalon every 8 hours for cough Mucinex DM twice daily Augmentin twice daily for 1 week to cover sinus infection Rest and fluids Follow-up if not improving or worsening

## 2020-12-11 NOTE — ED Provider Notes (Signed)
[facility]    CSN: [id number] Arrival date & time: 12/11/20  3419      History   Chief Complaint Chief Complaint  Patient presents with  . Cough  . Otalgia  . Chest Pain  . head pressure    HPI Karina Ayers is a 65 y.o. female history of endometriosis, hypertension, presenting today for evaluation of cough.  Reports that she has had a productive cough, sinus pressure into her ear and chest area.  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  Reports associated wheezing and some chest tightness.  Her sinus pressure on the right side.  Has had some subjective fevers.  Reports thick discolored mucus.  HPI  Past Medical History:  Diagnosis Date  . Hypertension   . Leg edema 05/21/2020  . Palpitations 05/21/2020  . PVC's (premature ventricular contractions) 07/22/2020    Patient Active Problem List   Diagnosis Date Noted  . PVC's (premature ventricular contractions) 07/22/2020  . Palpitations 05/21/2020  . Leg edema 05/21/2020  . Shoulder pain 04/15/2011  . HYPERTENSION, BENIGN ESSENTIAL 11/01/2006  . ALLERGIC RHINITIS WITH CONJUNCTIVITIS 11/01/2006  . OBESITY, NOS 10/20/2006  . DEPRESSION, MAJOR, RECURRENT 10/20/2006  . DEPRESSIVE DISORDER, NOS 10/20/2006  . THROMBOPHLEBITIS, DEEP LEG 10/20/2006  . ENDOMETRIOSIS 10/20/2006    Past Surgical History:  Procedure Laterality Date  . ABDOMINAL HYSTERECTOMY    . ACHILLES TENDON REPAIR Right 2004  . GASTRIC BYPASS      OB History   No obstetric history on file.      Home Medications    Prior to Admission medications   Medication Sig Start Date End Date Taking? Authorizing Provider  albuterol (VENTOLIN HFA) 108 (90 Base) MCG/ACT inhaler Inhale 1-2 puffs into the lungs every 6 (six) hours as needed for wheezing or shortness of breath. 10/07/20  Yes Lesleigh Hughson C, PA-C  amoxicillin-clavulanate (AUGMENTIN) 875-125 MG tablet  Take 1 tablet by mouth every 12 (twelve) hours for 7 days. 12/11/20 12/18/20 Yes Merrell Borsuk C, PA-C  benzonatate (TESSALON) 200 MG capsule Take 1 capsule (200 mg total) by mouth 3 (three) times daily as needed for up to 7 days for cough. 12/11/20 12/18/20 Yes Leslie Langille C, PA-C  irbesartan (AVAPRO) 75 MG tablet Take 1 tablet (75 mg total) by mouth daily. 05/21/20  Yes Chilton Si, MD  methylPREDNISolone (MEDROL DOSEPAK) 4 MG TBPK tablet Take as directed 12/11/20  Yes Caitlin Hillmer C, PA-C  chlorthalidone (HYGROTON) 25 MG tablet Take 1 tablet (25 mg total) by mouth daily. 07/02/20 09/30/20  Chilton Si, MD    Family History Family History  Problem Relation Age of Onset  . Hypertension Mother   . Diabetes Mother   . Diverticulitis Mother   . CAD Mother   . Breast cancer Mother   . Hypertension Father   . Dementia Father   . Osteoporosis Father   . Breast cancer Maternal Grandmother   . Breast cancer Sister   . Hypertension Sister   . Diabetes Sister     Social History Social History   Tobacco Use  . Smoking status: Never Smoker  . Smokeless tobacco: Never Used  Substance Use Topics  . Alcohol use: No  . Drug use: No     Allergies   Cortisone, Losartan potassium, Neosporin [neomycin-bacitracin zn-polymyx], and Prednisone   Review of Systems Review of Systems  Constitutional: Positive for fatigue and fever. Negative for activity  change, appetite change and chills.  HENT: Positive for congestion, rhinorrhea, sinus pressure and sore throat. Negative for ear pain and trouble swallowing.   Eyes: Negative for discharge and redness.  Respiratory: Positive for cough and wheezing. Negative for chest tightness and shortness of breath.   Cardiovascular: Negative for chest pain.  Gastrointestinal: Negative for abdominal pain, diarrhea, nausea and vomiting.  Musculoskeletal: Negative for myalgias.  Skin: Negative for rash.  Neurological: Positive for headaches.  Negative for dizziness and light-headedness.     Physical Exam Triage Vital Signs ED Triage Vitals  Enc Vitals Group     BP 12/11/20 0914 (!) 139/92     Pulse Rate 12/11/20 0914 (!) 108     Resp 12/11/20 0914 20     Temp 12/11/20 0914 100.2 F (37.9 C)     Temp src --      SpO2 12/11/20 0914 94 %     Weight --      Height --      Head Circumference --      Peak Flow --      Pain Score 12/11/20 0910 5     Pain Loc --      Pain Edu? --      Excl. in GC? --    No data found.  Updated Vital Signs BP (!) 139/92   Pulse (!) 108   Temp 100.2 F (37.9 C)   Resp 20   SpO2 94%   Visual Acuity Right Eye Distance:   Left Eye Distance:   Bilateral Distance:    Right Eye Near:   Left Eye Near:    Bilateral Near:     Physical Exam Vitals and nursing note reviewed.  Constitutional:      Appearance: She is well-developed.     Comments: No acute distress  HENT:     Head: Normocephalic and atraumatic.     Ears:     Comments: Bilateral ears without tenderness to palpation of external auricle, tragus and mastoid, EAC's without erythema or swelling, TM's with good bony landmarks and cone of light. Non erythematous.     Nose: Nose normal.     Mouth/Throat:     Comments: Oral mucosa pink and moist, no tonsillar enlargement or exudate. Posterior pharynx patent and nonerythematous, no uvula deviation or swelling. Normal phonation. Eyes:     Conjunctiva/sclera: Conjunctivae normal.  Cardiovascular:     Rate and Rhythm: Normal rate and regular rhythm.  Pulmonary:     Effort: Pulmonary effort is normal. No respiratory distress.     Comments: Breathing comfortably at rest, CTABL, no wheezing, rales or other adventitious sounds auscultated Abdominal:     General: There is no distension.  Musculoskeletal:        General: Normal range of motion.     Cervical back: Neck supple.  Skin:    General: Skin is warm and dry.  Neurological:     Mental Status: She is alert and oriented to  person, place, and time.      UC Treatments / Results  Labs (all labs ordered are listed, but only abnormal results are displayed) Labs Reviewed  NOVEL CORONAVIRUS, NAA    EKG   Radiology No results found.  Procedures Procedures (including critical care time)  Medications Ordered in UC Medications - No data to display  Initial Impression / Assessment and Plan / UC Course  I have reviewed the triage vital signs and the nursing notes.  Pertinent labs & imaging results  that were available during my care of the patient were reviewed by me and considered in my medical decision making (see chart for details).     COVID test pending, treating for bronchitis given reported wheezing, continue albuterol inhaler which she already has at home, Medrol Dosepak, Tessalon and Mucinex, did opt to go ahead and initiate on antibiotics as well given fever, one-sided pressure and reported thick discolored mucus.  Augmentin prescribed to treat sinusitis.  Also discussed with patient symptoms may need to run course over approximately 7 to 10 days.  Continue to monitor symptoms and breathing,Discussed strict return precautions. Patient verbalized understanding and is agreeable with plan.      Final Clinical Impressions(s) / UC Diagnoses   Final diagnoses:  Encounter for laboratory testing for COVID-19 virus  Acute bronchitis, unspecified organism  Acute non-recurrent maxillary sinusitis     Discharge Instructions     COVID test pending, monitor MyChart for results Use albuterol inhaler 1 to 2 puffs every 4-6 hours as needed for shortness of breath, chest tightness and wheezing Begin Medrol Dosepak to help with wheezing, sinus inflammation/pressure Tessalon every 8 hours for cough Mucinex DM twice daily Augmentin twice daily for 1 week to cover sinus infection Rest and fluids Follow-up if not improving or worsening    ED Prescriptions    Medication Sig Dispense Auth. Provider    methylPREDNISolone (MEDROL DOSEPAK) 4 MG TBPK tablet Take as directed 21 each Brenton Joines C, PA-C   benzonatate (TESSALON) 200 MG capsule Take 1 capsule (200 mg total) by mouth 3 (three) times daily as needed for up to 7 days for cough. 28 capsule Naidelin Gugliotta C, PA-C   amoxicillin-clavulanate (AUGMENTIN) 875-125 MG tablet Take 1 tablet by mouth every 12 (twelve) hours for 7 days. 14 tablet Malarie Tappen, [place] C, PA-C     PDMP not reviewed this encounter.   Lew Dawes, [place] 12/11/20 [phone]

## 2020-12-12 LAB — NOVEL CORONAVIRUS, NAA: SARS-CoV-2, NAA: NOT DETECTED

## 2020-12-12 LAB — SARS-COV-2, NAA 2 DAY TAT

## 2021-03-31 ENCOUNTER — Other Ambulatory Visit: Payer: Self-pay | Admitting: Family Medicine

## 2021-03-31 DIAGNOSIS — N958 Other specified menopausal and perimenopausal disorders: Secondary | ICD-10-CM

## 2021-03-31 DIAGNOSIS — Z1231 Encounter for screening mammogram for malignant neoplasm of breast: Secondary | ICD-10-CM

## 2021-05-03 ENCOUNTER — Other Ambulatory Visit: Payer: Self-pay | Admitting: Cardiovascular Disease

## 2021-05-20 ENCOUNTER — Other Ambulatory Visit: Payer: Self-pay | Admitting: Family Medicine

## 2021-05-20 ENCOUNTER — Ambulatory Visit
Admission: RE | Admit: 2021-05-20 | Discharge: 2021-05-20 | Disposition: A | Discharge status: Home / Self Care | Payer: No Typology Code available for payment source | Source: Ambulatory Visit | Admitting: Radiology

## 2021-05-20 ENCOUNTER — Other Ambulatory Visit
Admission: RE | Admit: 2021-05-20 | Discharge: 2021-05-20 | Disposition: A | Discharge status: Home / Self Care | Payer: No Typology Code available for payment source | Source: Ambulatory Visit | Attending: Family Medicine | Admitting: Family Medicine

## 2021-05-20 ENCOUNTER — Ambulatory Visit
Admission: RE | Admit: 2021-05-20 | Discharge: 2021-05-20 | Disposition: A | Discharge status: Home / Self Care | Payer: No Typology Code available for payment source | Source: Ambulatory Visit | Attending: Family Medicine | Admitting: Family Medicine

## 2021-05-20 ENCOUNTER — Other Ambulatory Visit: Payer: Self-pay

## 2021-05-20 DIAGNOSIS — N6489 Other specified disorders of breast: Secondary | ICD-10-CM

## 2021-05-20 DIAGNOSIS — R7301 Impaired fasting glucose: Secondary | ICD-10-CM | POA: Insufficient documentation

## 2021-05-20 DIAGNOSIS — M8589 Other specified disorders of bone density and structure, multiple sites: Secondary | ICD-10-CM

## 2021-05-20 DIAGNOSIS — Z1231 Encounter for screening mammogram for malignant neoplasm of breast: Secondary | ICD-10-CM | POA: Insufficient documentation

## 2021-05-20 DIAGNOSIS — Z803 Family history of malignant neoplasm of breast: Secondary | ICD-10-CM

## 2021-05-20 DIAGNOSIS — N958 Other specified menopausal and perimenopausal disorders: Secondary | ICD-10-CM | POA: Insufficient documentation

## 2021-05-20 DIAGNOSIS — Z780 Asymptomatic menopausal state: Secondary | ICD-10-CM

## 2021-05-20 LAB — CBC
Hematocrit: 47 % — ABNORMAL HIGH (ref 34–45)
Hemoglobin: 15.6 g/dL (ref 11.2–15.7)
MCH: 34 pg — ABNORMAL HIGH (ref 26–32)
MCHC: 34 g/dL (ref 32–36)
MCV: 100 fL — ABNORMAL HIGH (ref 79–95)
Platelets: 274 10*3/uL (ref 160–370)
RBC: 4.6 MIL/uL (ref 3.9–5.2)
RDW: 13.8 % (ref 11.7–14.4)
WBC: 9.2 10*3/uL (ref 4.0–10.0)

## 2021-05-20 LAB — COMPREHENSIVE METABOLIC PANEL
ALT: 16 U/L (ref 0–35)
AST: 20 U/L (ref 0–35)
Albumin: 4 g/dL (ref 3.5–5.2)
Alk Phos: 122 U/L — ABNORMAL HIGH (ref 35–105)
Anion Gap: 10 (ref 7–16)
Bilirubin,Total: 0.3 mg/dL (ref 0.0–1.2)
CO2: 27 mmol/L (ref 20–28)
Calcium: 9.6 mg/dL (ref 8.6–10.2)
Chloride: 106 mmol/L (ref 96–108)
Creatinine: 0.82 mg/dL (ref 0.51–0.95)
Glucose: 90 mg/dL (ref 60–99)
Lab: 11 mg/dL (ref 6–20)
Potassium: 4 mmol/L (ref 3.4–4.7)
Sodium: 143 mmol/L (ref 133–145)
Total Protein: 6.6 g/dL (ref 6.3–7.7)
eGFR BY CREAT: 80 *

## 2021-05-20 LAB — LIPID PANEL
Chol/HDL Ratio: 3.1
Cholesterol: 177 mg/dL
HDL: 57 mg/dL (ref 40–60)
LDL Calculated: 99 mg/dL
Non HDL Cholesterol: 120 mg/dL
Triglycerides: 105 mg/dL

## 2021-05-20 LAB — TSH: TSH: 0.68 u[IU]/mL (ref 0.27–4.20)

## 2021-05-21 LAB — HEMOGLOBIN A1C: Hemoglobin A1C: 5.7 % — ABNORMAL HIGH

## 2021-06-01 ENCOUNTER — Other Ambulatory Visit: Payer: Self-pay | Admitting: Student in an Organized Health Care Education/Training Program

## 2021-06-01 DIAGNOSIS — N6489 Other specified disorders of breast: Secondary | ICD-10-CM

## 2021-06-12 ENCOUNTER — Ambulatory Visit: Payer: No Typology Code available for payment source | Admitting: Radiology

## 2021-06-12 ENCOUNTER — Ambulatory Visit: Payer: No Typology Code available for payment source

## 2021-07-09 ENCOUNTER — Other Ambulatory Visit: Payer: Self-pay

## 2021-07-09 ENCOUNTER — Emergency Department
Admission: EM | Admit: 2021-07-09 | Discharge: 2021-07-09 | Discharge status: Against Medical Advice | Payer: No Typology Code available for payment source | Source: Ambulatory Visit

## 2021-07-09 NOTE — ED Triage Notes (Signed)
Rectal bleeding for a week  Feels dizzy at times  Lightheaded -  Called md and told to come in for eval  Report she is under a lot of stress- reports dark colored stool   is not covid vaccinated

## 2021-07-20 ENCOUNTER — Other Ambulatory Visit: Payer: Self-pay | Admitting: Cardiovascular Disease

## 2021-07-21 ENCOUNTER — Other Ambulatory Visit: Payer: Self-pay | Admitting: Gastroenterology

## 2021-08-21 ENCOUNTER — Other Ambulatory Visit: Payer: Self-pay

## 2021-08-21 ENCOUNTER — Ambulatory Visit
Admission: RE | Admit: 2021-08-21 | Discharge: 2021-08-21 | Disposition: A | Discharge status: Home / Self Care | Payer: No Typology Code available for payment source | Source: Ambulatory Visit | Attending: Family Medicine | Admitting: Family Medicine

## 2021-08-21 ENCOUNTER — Ambulatory Visit
Admission: RE | Admit: 2021-08-21 | Discharge: 2021-08-21 | Disposition: A | Discharge status: Home / Self Care | Payer: No Typology Code available for payment source | Source: Ambulatory Visit

## 2021-08-21 DIAGNOSIS — N6489 Other specified disorders of breast: Secondary | ICD-10-CM

## 2021-09-19 ENCOUNTER — Other Ambulatory Visit
Admission: RE | Admit: 2021-09-19 | Discharge: 2021-09-19 | Disposition: A | Discharge status: Home / Self Care | Payer: No Typology Code available for payment source | Source: Ambulatory Visit | Attending: Gastroenterology | Admitting: Gastroenterology

## 2021-09-19 ENCOUNTER — Ambulatory Visit: Payer: No Typology Code available for payment source

## 2021-09-19 DIAGNOSIS — Z20822 Contact with and (suspected) exposure to covid-19: Secondary | ICD-10-CM | POA: Insufficient documentation

## 2021-09-19 DIAGNOSIS — Z20828 Contact with and (suspected) exposure to other viral communicable diseases: Secondary | ICD-10-CM | POA: Insufficient documentation

## 2021-09-20 LAB — COVID-19 NAAT (PCR): COVID-19 NAAT (PCR): NEGATIVE

## 2021-09-24 ENCOUNTER — Other Ambulatory Visit: Payer: Self-pay

## 2021-09-24 ENCOUNTER — Encounter
Admission: RE | Disposition: A | Discharge status: Home / Self Care | Payer: Self-pay | Source: Ambulatory Visit | Attending: Gastroenterology

## 2021-09-24 ENCOUNTER — Encounter: Payer: Self-pay | Admitting: Gastroenterology

## 2021-09-24 ENCOUNTER — Ambulatory Visit
Admission: RE | Admit: 2021-09-24 | Discharge: 2021-09-24 | Disposition: A | Discharge status: Home / Self Care | Payer: No Typology Code available for payment source | Source: Ambulatory Visit | Attending: Gastroenterology | Admitting: Gastroenterology

## 2021-09-24 DIAGNOSIS — Z86010 Personal history of colonic polyps: Secondary | ICD-10-CM | POA: Insufficient documentation

## 2021-09-24 DIAGNOSIS — K5730 Diverticulosis of large intestine without perforation or abscess without bleeding: Secondary | ICD-10-CM | POA: Insufficient documentation

## 2021-09-24 DIAGNOSIS — D122 Benign neoplasm of ascending colon: Secondary | ICD-10-CM

## 2021-09-24 DIAGNOSIS — K621 Rectal polyp: Secondary | ICD-10-CM

## 2021-09-24 DIAGNOSIS — K648 Other hemorrhoids: Secondary | ICD-10-CM | POA: Insufficient documentation

## 2021-09-24 DIAGNOSIS — Z1211 Encounter for screening for malignant neoplasm of colon: Secondary | ICD-10-CM

## 2021-09-24 HISTORY — PX: PR COLONOSCOPY FLX DX W/COLLJ SPEC WHEN PFRMD: 45378

## 2021-09-24 SURGERY — COLONOSCOPY

## 2021-09-24 MED ORDER — MIDAZOLAM HCL 1 MG/ML IJ SOLN *I* WRAPPED
INTRAMUSCULAR | Status: AC | PRN
Start: 2021-09-24 — End: 2021-09-24
  Administered 2021-09-24: 1 mg via INTRAVENOUS
  Administered 2021-09-24: 2 mg via INTRAVENOUS
  Administered 2021-09-24: 1 mg via INTRAVENOUS

## 2021-09-24 MED ORDER — FENTANYL CITRATE 50 MCG/ML IJ SOLN *WRAPPED*
INTRAMUSCULAR | Status: AC
Start: 2021-09-24 — End: 2021-09-24
  Filled 2021-09-24: qty 2

## 2021-09-24 MED ORDER — SODIUM CHLORIDE 0.9 % IV SOLN WRAPPED *I*
50.0000 mL/h | Status: DC
Start: 2021-09-24 — End: 2021-09-24
  Administered 2021-09-24: 50 mL/h via INTRAVENOUS

## 2021-09-24 MED ORDER — FENTANYL CITRATE 50 MCG/ML IJ SOLN *WRAPPED*
INTRAMUSCULAR | Status: AC | PRN
Start: 2021-09-24 — End: 2021-09-24
  Administered 2021-09-24: 50 ug via INTRAVENOUS
  Administered 2021-09-24 (×2): 25 ug via INTRAVENOUS

## 2021-09-24 MED ORDER — DEXTROSE 5 % FLUSH FOR PUMPS *I*
0.0000 mL/h | INTRAVENOUS | Status: DC | PRN
Start: 2021-09-24 — End: 2021-09-24

## 2021-09-24 MED ORDER — LIDOCAINE HCL (PF) 1 % IJ SOLN *I*
0.1000 mL | INTRAMUSCULAR | Status: DC | PRN
Start: 2021-09-24 — End: 2021-09-24

## 2021-09-24 MED ORDER — SODIUM CHLORIDE 0.9 % FLUSH FOR PUMPS *I*
0.0000 mL/h | INTRAVENOUS | Status: DC | PRN
Start: 2021-09-24 — End: 2021-09-24

## 2021-09-24 MED ORDER — MIDAZOLAM HCL 1 MG/ML IJ SOLN *I* WRAPPED
INTRAMUSCULAR | Status: AC
Start: 2021-09-24 — End: 2021-09-24
  Filled 2021-09-24: qty 10

## 2021-09-24 SURGICAL SUPPLY — 16 items
CLIP HEMOSTASIS ENDO INSTINCT 7FRX230CM (Supply) IMPLANT
ELECTRODE ADULT POLYHESIVE PAT RETURN (Supply) IMPLANT
FORCEPS BIOPSY RADIAL JAW LG (Other) IMPLANT
FORCEPS DISPOSABLE ALLIGATOR JAW W/NEEDLE (Supply) IMPLANT
PROBE CIRCUMFERENTAIL FIRE (Supply) IMPLANT
PROBE COAG 2000A 6.9FR L2.2M INTEGR STYL W/ FLTR FOR FLX ENDOSCP INTERVENTION FIAPC (Supply) IMPLANT
PROBE COAG 6.9FR L2.2M CNVNENT BLT IN FLTR 2200SC SIMPLIFIED SETUP PLUG PLAY FUNCTIONALITY (Supply) IMPLANT
PROBE SIDE FIRE (Supply)
PROBE STRAIGHT FIRE (Supply)
SNARE MICRO MINI (Supply)
SNARE MINI SOFT ACU 1.5 X 3 (Supply) ×2 IMPLANT
SNARE POLYP 1CMX1.5CM SFT GI MINI MIC OVL ACUSNR (Supply) IMPLANT
SNARE SOFT JUMBO ENDO DISPO (Supply) IMPLANT
SNARE STANDARD SOFT ACU 2.5 X 5.5 (Supply) IMPLANT
TRAP POLYP MULTICHAMBER DISP OPTIMIZER (Supply) IMPLANT
TRAPS POLY (Supply)

## 2021-09-24 NOTE — Discharge Instructions (Addendum)
[place]  Discharge Instructions  For Colonoscopy    09/24/2021    7:39 AM    Findings:Polyps removed and Diverticulosis    Do not drive, operate heavy machinery, drink alcoholic beverages, make important personal or business decisions, or sign legal documents until next day.    Follow a high fiber, low fat diet.   Smoking cessation is important.    Things you May Expect:  A small amount of bright red blood in your stool  It may be a few days before you have a bowel movement  You may have cramping, bloating, and feeling of "gas". These feelings should go away as you pass gas. If you still feel uncomfortable, walking around will help to pass the gas.  You were given medication to help you relax during the test. You may feel "fuzzy" and drowsy. Go home and rest for at least 4-6 hours.    You Should Call You Doctor For any of the Following:    Bad stomach pain   Fever   Bright red bleeding or clots (This may happen up to 2 weeks after the test.)   Dizziness or weakness that gets worse or last up to 24 hours.   Pain or redness at the IV site    If you have a serious problem after hours, Call [phone] to reach the GI physician on call. If you are unable to reach your doctor, go to the [facility].    Follow Up Care:  > If biopsies were taken a letter will be sent to you with the results within 7-10 days. If you do not receive a report after 2 weeks please call the office at [phone].  > Report will be sent to your primary doctor.    Call Dr. Chesley Noon office tel # [phone] if you have any further problems or concerns.      Await pathology before determining time of next colonoscopy

## 2021-09-24 NOTE — Preop H&P (Signed)
OUTPATIENT PRE-PROCEDURE H&P    Chief Complaint / Indications for Procedure: Surveillance colonoscopy    Past Medical History:     Past Medical History:   Diagnosis Date    Anxiety     Aortic valve disease     Arthritis     Barrett's esophagus     Colon polyp     COPD (chronic obstructive pulmonary disease)     Depression     GERD (gastroesophageal reflux disease)     Gout     Hiatal hernia     Hypothyroidism 08/05/2010    Migraine     once a week    PONV (postoperative nausea and vomiting)     after Gallbladder removed    Tobacco abuse      Past Surgical History:   Procedure Laterality Date    CHOLECYSTECTOMY  2005    po N/V kept overnight    nasal endoscopic polypectomy  [place]    with cyst    PR LAP, INCISIONAL HERNIA REPAIR,REDUCIBLE N/A 07/18/2019    Procedure: REPAIR, HERNIA, INCISIONAL, ROBOT-ASSISTED;  Surgeon: Audelia Hives, MD;  Location: [facility] MAIN OR;  Service: General    PR RECTAL TUMOR EXCISION TRANSANAL ENDOSCOPIC N/A 06/09/2017    Procedure: TRANSANAL ENDOSCOPIC MICROSURGERY (TEMS) ;  Surgeon: Doren Custard, MD;  Location: [facility] MAIN OR;  Service: Colorectal    PR SIGMOIDOSCOPY FLX DX W/COLLJ SPEC BR/WA IF PFRMD N/A 05/17/2017    FLEXIBLE SIGMOIDOSCOPY;  Surgeon: Doren Custard, MD;  Location: [facility] OR;  Service: Colorectal, no anesthesia issues      Family History   Problem Relation Age of Onset    Pacemaker Mother     Heart surgery Mother 34        3 bypass    COPD Brother     Anesthesia problems Neg Hx      Social History     Socioeconomic History    Marital status: Married   Tobacco Use    Smoking status: Every Day     Packs/day: 0.50     Years: 39.00     Pack years: 19.50     Types: Cigarettes     Start date: 09/10/1977    Smokeless tobacco: Never   Substance and Sexual Activity    Alcohol use: No    Drug use: No       Allergies:    Allergies   Allergen Reactions    No Known Drug Allergy      Created by Conversion - 0;         Medications:  Medications Prior to Admission   Medication Sig    Diclofenac Sodium CR (VOLTARIN-XR) 100 MG 24 hr tablet Take 1 tablet (100 mg total) by mouth daily Start after medrol    Multiple Vitamin (MULTI VITAMIN DAILY PO) Take by mouth    ARIPiprazole (ABILIFY) 2 MG tablet Take 2 mg by mouth nightly     ipratropium-albuterol (DUONEB) 0.5-2.33m /364mnebulizer solution Take by nebulization 4 times daily as needed    citalopram (CELEXA) 40 MG tablet Take 40 mg by mouth nightly       ALPRAZolam (XANAX) 0.25 MG tablet Take 0.25 mg by mouth 3 times daily as needed for Anxiety    esomeprazole (NEXIUM) 40 MG capsule Take 40 mg by mouth daily (at noon)       levothyroxine (SYNTHROID, LEVOTHROID) 75 MCG tablet Take 75 mcg by mouth daily (before breakfast)  rizatriptan (MAXALT) 10 MG tablet Take 10 mg by mouth as needed for Migraine   May repeat in 2 hours if needed.  Max 3 tabs/day.    allopurinol (ZYLOPRIM) 100 MG tablet Take 100 mg by mouth every morning       zolpidem (AMBIEN) 10 MG tablet Take 10 mg by mouth nightly    aspirin 81 MG tablet Take 81 mg by mouth every morning       Cyanocobalamin (VITAMIN B 12 PO) Take 1 tablet by mouth every morning        Current Facility-Administered Medications   Medication Dose Route Frequency    sodium chloride 0.9 % FLUSH REQUIRED IF PATIENT HAS IV  0-500 mL/hr Intravenous PRN    dextrose 5 % FLUSH REQUIRED IF PATIENT HAS IV  0-500 mL/hr Intravenous PRN    sodium chloride 0.9 % IV  50 mL/hr Intravenous Continuous    lidocaine PF 1 % injection 0.1 mL  0.1 mL Subcutaneous PRN     No current outpatient medications on file.     Vitals:    09/24/21 0723   BP: 118/89   Pulse:    Resp:    Temp:    Weight:    Height:            Physical Examination:Oropharynx: mucosa pink and moist  Lungs: percussion normal, good diaphragmatic excursion, lungs clear to auscultation bilaterally  Heart: regular rate and rhythm, no murmurs, gallops or rubs  Abdomen: abdomen soft,  non-tender, nondistended, normal active bowel sounds, no masses or organomegaly  Neuro: No focal neurology  Extremities/Musculoskeletal: No rash,cyanosis,or edema noted       Last Lab Results:   Lab Results   Component Value Date/Time    WBC 9.2 05/20/2021 01:55 PM    HCT 47 (H) 05/20/2021 01:55 PM    PLT 274 05/20/2021 01:55 PM    INR 1.0 05/31/2017 10:39 AM    AST 20 05/20/2021 01:55 PM    ALT 16 05/20/2021 01:55 PM    ALK 122 (H) 05/20/2021 01:55 PM    TB 0.3 05/20/2021 01:55 PM    NA 143 05/20/2021 01:55 PM    K 4.0 05/20/2021 01:55 PM    UN 11 05/20/2021 01:55 PM    CREAT 0.82 05/20/2021 01:55 PM         Radiology impressions (last 30 days):  No results found.    Currently Active Problems:  Patient Active Problem List   Diagnosis Code    Hypothyroidism E03.9    Rectal polyp s/p TEMS procedure K62.1    Aortic valve regurgitation I35.1        Impression:  Surveillance colonoscopy    Plan:  Colonoscopy  Moderate Sedation    UPDATES TO PATIENT'S CONDITION on the DAY OF SURGERY/PROCEDURE    I. Updates to Patient's Condition (to be completed by a provider privileged to complete a H&P, following reassessment of the patient by the provider):    Full H&P done today; no updates needed.    II. Procedure Readiness   I have reviewed the patient's H&P and updated condition. By completing and signing this form, I attest that this patient is ready for surgery/procedure.      III. Attestation   I have reviewed the updated information regarding the patient's condition and it is appropriate to proceed with the planned surgery/procedure.    Tonette Lederer, MD as of 7:38 AM 09/24/2021

## 2021-09-24 NOTE — Procedures (Signed)
Procedure Report    [facility] Colonoscopy Procedure Note     Date of Procedure: 09/24/2021   Primary Physician: Georgana Curio, MD        Attending Physician: Joneen Caraway. Jone Baseman, M.D.      Indications: Surveillance for history of colonic polyps, s/p TEMs for rectal polyp, smoker  Previous Colonoscopy:  Yes -- Date: 2018  Medications:Fentanyl 100 mcg IV and Midazolam 5 mg IV were administered incrementally over the course of the procedure to achieve an adequate level of conscious sedation.     Informed Consent: Prior to the procedure, the patient was explained the risk, benefits and alternatives including the risk of bleeding, infection, colon perforation, splenic injury or perforation and informed consent was obtained.     Procedure Details: The patient was placed in the left lateral decubitus position and monitored continuously with ECG tracing, pulse oximetry, blood pressure monitoring and direct observations. After anorectal examination was performed, the Olympus PCF-H180was inserted into the rectum and advanced under direct vision to the cecum, which was identified by  the ileocecal valve and the appendiceal orifice. The procedure was considered more difficult than average. Additional maneuvers used: applied abdominal pressure.    Bowel Preparation:  During withdrawal examination, the final quality of the prep was [facility] Bowel Prep Scale   Right Colon: Grade2- (minor amount of residual staining, small fragments of stool, and/or opaque liquid, but mucosa of colon segment is seen well)    Transverse Colon: Grade 2- (minor amount of residual staining, small fragments of stool, and/or opaque liquid, but mucosa of colon segment is seen well)    Left Colon: Grade 2- (minor amount of residual staining, small fragments of stool, and/or opaque liquid, but mucosa of colon segment is seen well)    Narrative:  A careful inspection was made as the colonoscope was withdrawn, a retroflexed view of the rectum was included;  findings and interventions are described below. Appropriate photo documentation wasobtained. The patient  recovered in the [facility].  Estimated Blood Loss:  There was no significant blood loss during the procedure.     Insertion Time: 1610  Cecum Time: 9604  Exit Time: 0808    Findings:   Colon: There was a small sessile 6 mm residual polyp located in the ascending colon at the previous site of polypectomy as marked by [place].  The polyp was removed with cold snare.  There was mild descending and sigmoid colon diverticulosis.  There was a small sessile 5 mm rectal polyp located at previous polypectomy site in the rectum removed with cold snare.  Small internal hemorrhoids seen on retroflexion.    Complications: The patient tolerated the procedure well.    Impression:   There was a small sessile 6 mm residual polyp located in the ascending colon at the previous site of polypectomy as marked by [place].  The polyp was removed with cold snare.  There was mild descending and sigmoid colon diverticulosis.  There was a small sessile 5 mm rectal polyp located at previous polypectomy site in the rectum removed with cold snare.  Small internal hemorrhoids seen on retroflexion.  Recommendations:   1. Await pathology  2. If hyperplastic polyp, repeat colonoscopy in 5 years.  3. If adenomatous polyp, repeat colonoscopy in 3 years.  4. Suggest high fiber diet.  5. Smoking cessation counseling.    Tonette Lederer, MD  Office Phone # [phone]

## 2021-09-25 ENCOUNTER — Encounter: Payer: Self-pay | Admitting: Gastroenterology

## 2021-09-25 LAB — SURGICAL PATHOLOGY

## 2021-10-18 ENCOUNTER — Other Ambulatory Visit: Payer: Self-pay | Admitting: Cardiovascular Disease

## 2021-10-19 NOTE — Telephone Encounter (Signed)
Rx(s) sent to pharmacy electronically.  

## 2021-10-27 ENCOUNTER — Other Ambulatory Visit: Payer: Self-pay

## 2021-10-27 ENCOUNTER — Encounter: Payer: Self-pay | Admitting: Podiatry

## 2021-10-27 ENCOUNTER — Ambulatory Visit (INDEPENDENT_AMBULATORY_CARE_PROVIDER_SITE_OTHER): Payer: BC Managed Care – PPO

## 2021-10-27 ENCOUNTER — Ambulatory Visit: Payer: BC Managed Care – PPO | Admitting: Podiatry

## 2021-10-27 DIAGNOSIS — M7662 Achilles tendinitis, left leg: Secondary | ICD-10-CM | POA: Diagnosis not present

## 2021-10-27 DIAGNOSIS — M722 Plantar fascial fibromatosis: Secondary | ICD-10-CM

## 2021-10-27 MED ORDER — MELOXICAM 15 MG PO TABS: 15.0000 mg | ORAL_TABLET | Freq: Every day | ORAL | 0 refills | Status: AC

## 2021-10-27 NOTE — Progress Notes (Signed)
?  Subjective:  ?Patient ID: Karina Ayers, female    DOB: 07/23/1956,   MRN: [id number] ? ?No chief complaint on file. ? ? ?66 y.o. female presents for concern of left foot pain that has been on a off for about a years. Relates pain on the bottom of the heel and back of ankle. Also relates burning and tingling that goes up her leg and to her hip. Denies any treatments.  Denies any other pedal complaints. Denies n/v/f/c.  ? ?Past Medical History:  ?Diagnosis Date  ? Hypertension   ? Leg edema 05/21/2020  ? Palpitations 05/21/2020  ? PVC's (premature ventricular contractions) 07/22/2020  ? ? ?Objective:  ?Physical Exam: ?Vascular: DP/PT pulses 2/4 bilateral. CFT <3 seconds. Normal hair growth on digits.  Bilateral lower extremity edema.  ?Skin. No lacerations or abrasions bilateral feet.  ?Musculoskeletal: MMT 5/5 bilateral lower extremities in DF, PF, Inversion and Eversion. Deceased ROM in DF of ankle joint. Tender to medial calcaneal tubercle on the left. Some pain along the proximal achilles tendon no pain along insertion. Some pain along lateral ankle and along peroneals.  ?Neurological: Sensation intact to light touch.  ? ?Assessment:  ? ?1. Plantar fasciitis of left foot   ?2. Tendonitis, Achilles, left   ? ? ? ?Plan:  ?Patient was evaluated and treated and all questions answered. ?Discussed plantar fasciitis and Achilles tendonitis  with patient.  ?X-rays reviewed and discussed with patient. No acute fractures or dislocations noted. Mild spurring noted at inferior calcaneus.  ?Discussed treatment options including, ice, NSAIDS, supportive shoes, bracing, and stretching. Stretching exercises provided to be done on a daily basis.   ?Prescription for meloxicam provided and sent to pharmacy. ?Patient defers injections today.  ?PF brace dispensed.  ?Follow-up 6 weeks or sooner if any problems arise. In the meantime, encouraged to call the office with any questions, concerns, change in symptoms.  ? ?  ? ? ?Louann Sjogren, DPM  ? ? ?

## 2021-10-27 NOTE — Patient Instructions (Signed)
Plantar Fasciitis (Heel Spur Syndrome) °with Rehab °The plantar fascia is a fibrous, ligament-like, soft-tissue structure that spans the bottom of the foot. Plantar fasciitis is a condition that causes pain in the foot due to inflammation of the tissue. °SYMPTOMS  °Pain and tenderness on the underneath side of the foot. °Pain that worsens with standing or walking. °CAUSES  °Plantar fasciitis is caused by irritation and injury to the plantar fascia on the underneath side of the foot. Common mechanisms of injury include: °Direct trauma to bottom of the foot. °Damage to a small nerve that runs under the foot where the main fascia attaches to the heel bone. °Stress placed on the plantar fascia due to bone spurs. °RISK INCREASES WITH:  °Activities that place stress on the plantar fascia (running, jumping, pivoting, or cutting). °Poor strength and flexibility. °Improperly fitted shoes. °Tight calf muscles. °Flat feet. °Failure to warm-up properly before activity. °Obesity. °PREVENTION °Warm up and stretch properly before activity. °Allow for adequate recovery between workouts. °Maintain physical fitness: °Strength, flexibility, and endurance. °Cardiovascular fitness. °Maintain a health body weight. °Avoid stress on the plantar fascia. °Wear properly fitted shoes, including arch supports for individuals who have flat feet. ° °PROGNOSIS  °If treated properly, then the symptoms of plantar fasciitis usually resolve without surgery. However, occasionally surgery is necessary. ° °RELATED COMPLICATIONS  °Recurrent symptoms that may result in a chronic condition. °Problems of the lower back that are caused by compensating for the injury, such as limping. °Pain or weakness of the foot during push-off following surgery. °Chronic inflammation, scarring, and partial or complete fascia tear, occurring more often from repeated injections. ° °TREATMENT  °Treatment initially involves the use of ice and medication to help reduce pain and  inflammation. The use of strengthening and stretching exercises may help reduce pain with activity, especially stretches of the Achilles tendon. These exercises may be performed at home or with a therapist. Your caregiver may recommend that you use heel cups of arch supports to help reduce stress on the plantar fascia. Occasionally, corticosteroid injections are given to reduce inflammation. If symptoms persist for greater than 6 months despite non-surgical (conservative), then surgery may be recommended.  ° °MEDICATION  °If pain medication is necessary, then nonsteroidal anti-inflammatory medications, such as aspirin and ibuprofen, or other minor pain relievers, such as acetaminophen, are often recommended. °Do not take pain medication within 7 days before surgery. °Prescription pain relievers may be given if deemed necessary by your caregiver. Use only as directed and only as much as you need. °Corticosteroid injections may be given by your caregiver. These injections should be reserved for the most serious cases, because they may only be given a certain number of times. ° °HEAT AND COLD °Cold treatment (icing) relieves pain and reduces inflammation. Cold treatment should be applied for 10 to 15 minutes every 2 to 3 hours for inflammation and pain and immediately after any activity that aggravates your symptoms. Use ice packs or massage the area with a piece of ice (ice massage). °Heat treatment may be used prior to performing the stretching and strengthening activities prescribed by your caregiver, physical therapist, or athletic trainer. Use a heat pack or soak the injury in warm water. ° °SEEK IMMEDIATE MEDICAL CARE IF: °Treatment seems to offer no benefit, or the condition worsens. °Any medications produce adverse side effects. ° °EXERCISES- °RANGE OF MOTION (ROM) AND STRETCHING EXERCISES - Plantar Fasciitis (Heel Spur Syndrome) °These exercises may help you when beginning to rehabilitate your injury. Your    symptoms may resolve with or without further involvement from your physician, physical therapist or athletic trainer. While completing these exercises, remember:  °Restoring tissue flexibility helps normal motion to return to the joints. This allows healthier, less painful movement and activity. °An effective stretch should be held for at least 30 seconds. °A stretch should never be painful. You should only feel a gentle lengthening or release in the stretched tissue. ° °RANGE OF MOTION - Toe Extension, Flexion °Sit with your right / left leg crossed over your opposite knee. °Grasp your toes and gently pull them back toward the top of your foot. You should feel a stretch on the bottom of your toes and/or foot. °Hold this stretch for 10 seconds. °Now, gently pull your toes toward the bottom of your foot. You should feel a stretch on the top of your toes and or foot. °Hold this stretch for 10 seconds. °Repeat  times. Complete this stretch 3 times per day.  ° °RANGE OF MOTION - Ankle Dorsiflexion, Active Assisted °Remove shoes and sit on a chair that is preferably not on a carpeted surface. °Place right / left foot under knee. Extend your opposite leg for support. °Keeping your heel down, slide your right / left foot back toward the chair until you feel a stretch at your ankle or calf. If you do not feel a stretch, slide your bottom forward to the edge of the chair, while still keeping your heel down. °Hold this stretch for 10 seconds. °Repeat 3 times. Complete this stretch 2 times per day.  ° °STRETCH  Gastroc, Standing °Place hands on wall. °Extend right / left leg, keeping the front knee somewhat bent. °Slightly point your toes inward on your back foot. °Keeping your right / left heel on the floor and your knee straight, shift your weight toward the wall, not allowing your back to arch. °You should feel a gentle stretch in the right / left calf. Hold this position for 10 seconds. °Repeat 3 times. Complete this  stretch 2 times per day. ° °STRETCH  Soleus, Standing °Place hands on wall. °Extend right / left leg, keeping the other knee somewhat bent. °Slightly point your toes inward on your back foot. °Keep your right / left heel on the floor, bend your back knee, and slightly shift your weight over the back leg so that you feel a gentle stretch deep in your back calf. °Hold this position for 10 seconds. °Repeat 3 times. Complete this stretch 2 times per day. ° °STRETCH  Gastrocsoleus, Standing  °Note: This exercise can place a lot of stress on your foot and ankle. Please complete this exercise only if specifically instructed by your caregiver.  °Place the ball of your right / left foot on a step, keeping your other foot firmly on the same step. °Hold on to the wall or a rail for balance. °Slowly lift your other foot, allowing your body weight to press your heel down over the edge of the step. °You should feel a stretch in your right / left calf. °Hold this position for 10 seconds. °Repeat this exercise with a slight bend in your right / left knee. °Repeat 3 times. Complete this stretch 2 times per day.  ° °STRENGTHENING EXERCISES - Plantar Fasciitis (Heel Spur Syndrome)  °These exercises may help you when beginning to rehabilitate your injury. They may resolve your symptoms with or without further involvement from your physician, physical therapist or athletic trainer. While completing these exercises, remember:  °Muscles can   gain both the endurance and the strength needed for everyday activities through controlled exercises. Complete these exercises as instructed by your physician, physical therapist or athletic trainer. Progress the resistance and repetitions only as guided.  STRENGTH - Towel Curls Sit in a chair positioned on a non-carpeted surface. Place your foot on a towel, keeping your heel on the floor. Pull the towel toward your heel by only curling your toes. Keep your heel on the floor. Repeat 3 times.  Complete this exercise 2 times per day.  STRENGTH - Ankle Inversion Secure one end of a rubber exercise band/tubing to a fixed object (table, pole). Loop the other end around your foot just before your toes. Place your fists between your knees. This will focus your strengthening at your ankle. Slowly, pull your big toe up and in, making sure the band/tubing is positioned to resist the entire motion. Hold this position for 10 seconds. Have your muscles resist the band/tubing as it slowly pulls your foot back to the starting position. Repeat 3 times. Complete this exercises 2 times per day.  Document Released: 08/09/2005 Document Revised: 11/01/2011 Document Reviewed: 11/21/2008 [facility] Patient Information 2014 [place], [place].   Achilles Tendinitis  with Rehab Achilles tendinitis is a disorder of the Achilles tendon. The Achilles tendon connects the large calf muscles (Gastrocnemius and Soleus) to the heel bone (calcaneus). This tendon is sometimes called the heel cord. It is important for pushing-off and standing on your toes and is important for walking, running, or jumping. Tendinitis is often caused by overuse and repetitive microtrauma. SYMPTOMS Pain, tenderness, swelling, warmth, and redness may occur over the Achilles tendon even at rest. Pain with pushing off, or flexing or extending the ankle. Pain that is worsened after or during activity. CAUSES  Overuse sometimes seen with rapid increase in exercise programs or in sports requiring running and jumping. Poor physical conditioning (strength and flexibility or endurance). Running sports, especially training running down hills. Inadequate warm-up before practice or play or failure to stretch before participation. Injury to the tendon. PREVENTION  Warm up and stretch before practice or competition. Allow time for adequate rest and recovery between practices and competition. Keep up conditioning. Keep up ankle and leg  flexibility. Improve or keep muscle strength and endurance. Improve cardiovascular fitness. Use proper technique. Use proper equipment (shoes, skates). To help prevent recurrence, taping, protective strapping, or an adhesive bandage may be recommended for several weeks after healing is complete. PROGNOSIS  Recovery may take weeks to several months to heal. Longer recovery is expected if symptoms have been prolonged. Recovery is usually quicker if the inflammation is due to a direct blow as compared with overuse or sudden strain. RELATED COMPLICATIONS  Healing time will be prolonged if the condition is not correctly treated. The injury must be given plenty of time to heal. Symptoms can reoccur if activity is resumed too soon. Untreated, tendinitis may increase the risk of tendon rupture requiring additional time for recovery and possibly surgery. TREATMENT  The first treatment consists of rest anti-inflammatory medication, and ice to relieve the pain. Stretching and strengthening exercises after resolution of pain will likely help reduce the risk of recurrence. Referral to a physical therapist or athletic trainer for further evaluation and treatment may be helpful. A walking boot or cast may be recommended to rest the Achilles tendon. This can help break the cycle of inflammation and microtrauma. Arch supports (orthotics) may be prescribed or recommended by your caregiver as an adjunct to therapy and  rest. Surgery to remove the inflamed tendon lining or degenerated tendon tissue is rarely necessary and has shown less than predictable results. MEDICATION  Nonsteroidal anti-inflammatory medications, such as aspirin and ibuprofen, may be used for pain and inflammation relief. Do not take within 7 days before surgery. Take these as directed by your caregiver. Contact your caregiver immediately if any bleeding, stomach upset, or signs of allergic reaction occur. Other minor pain relievers, such as  acetaminophen, may also be used. Pain relievers may be prescribed as necessary by your caregiver. Do not take prescription pain medication for longer than 4 to 7 days. Use only as directed and only as much as you need. Cortisone injections are rarely indicated. Cortisone injections may weaken tendons and predispose to rupture. It is better to give the condition more time to heal than to use them. HEAT AND COLD Cold is used to relieve pain and reduce inflammation for acute and chronic Achilles tendinitis. Cold should be applied for 10 to 15 minutes every 2 to 3 hours for inflammation and pain and immediately after any activity that aggravates your symptoms. Use ice packs or an ice massage. Heat may be used before performing stretching and strengthening activities prescribed by your caregiver. Use a heat pack or a warm soak. SEEK MEDICAL CARE IF: Symptoms get worse or do not improve in 2 weeks despite treatment. New, unexplained symptoms develop. Drugs used in treatment may produce side effects.  EXERCISES:  RANGE OF MOTION (ROM) AND STRETCHING EXERCISES - Achilles Tendinitis  These exercises may help you when beginning to rehabilitate your injury. Your symptoms may resolve with or without further involvement from your physician, physical therapist or athletic trainer. While completing these exercises, remember:  Restoring tissue flexibility helps normal motion to return to the joints. This allows healthier, less painful movement and activity. An effective stretch should be held for at least 30 seconds. A stretch should never be painful. You should only feel a gentle lengthening or release in the stretched tissue.  STRETCH  Gastroc, Standing  Place hands on wall. Extend right / left leg, keeping the front knee somewhat bent. Slightly point your toes inward on your back foot. Keeping your right / left heel on the floor and your knee straight, shift your weight toward the wall, not allowing your  back to arch. You should feel a gentle stretch in the right / left calf. Hold this position for 10 seconds. Repeat 3 times. Complete this stretch 2 times per day.  STRETCH  Soleus, Standing  Place hands on wall. Extend right / left leg, keeping the other knee somewhat bent. Slightly point your toes inward on your back foot. Keep your right / left heel on the floor, bend your back knee, and slightly shift your weight over the back leg so that you feel a gentle stretch deep in your back calf. Hold this position for 10 seconds. Repeat 3 times. Complete this stretch 2 times per day.  STRETCH  Gastrocsoleus, Standing  Note: This exercise can place a lot of stress on your foot and ankle. Please complete this exercise only if specifically instructed by your caregiver.  Place the ball of your right / left foot on a step, keeping your other foot firmly on the same step. Hold on to the wall or a rail for balance. Slowly lift your other foot, allowing your body weight to press your heel down over the edge of the step. You should feel a stretch in your right /  left calf. Hold this position for 10 seconds. Repeat this exercise with a slight bend in your knee. Repeat 3 times. Complete this stretch 2 times per day.   STRENGTHENING EXERCISES - Achilles Tendinitis These exercises may help you when beginning to rehabilitate your injury. They may resolve your symptoms with or without further involvement from your physician, physical therapist or athletic trainer. While completing these exercises, remember:  Muscles can gain both the endurance and the strength needed for everyday activities through controlled exercises. Complete these exercises as instructed by your physician, physical therapist or athletic trainer. Progress the resistance and repetitions only as guided. You may experience muscle soreness or fatigue, but the pain or discomfort you are trying to eliminate should never worsen during these  exercises. If this pain does worsen, stop and make certain you are following the directions exactly. If the pain is still present after adjustments, discontinue the exercise until you can discuss the trouble with your clinician.  STRENGTH - Plantar-flexors  Sit with your right / left leg extended. Holding onto both ends of a rubber exercise band/tubing, loop it around the ball of your foot. Keep a slight tension in the band. Slowly push your toes away from you, pointing them downward. Hold this position for 10 seconds. Return slowly, controlling the tension in the band/tubing. Repeat 3 times. Complete this exercise 2 times per day.   STRENGTH - Plantar-flexors  Stand with your feet shoulder width apart. Steady yourself with a wall or table using as little support as needed. Keeping your weight evenly spread over the width of your feet, rise up on your toes.* Hold this position for 10 seconds. Repeat 3 times. Complete this exercise 2 times per day.  *If this is too easy, shift your weight toward your right / left leg until you feel challenged. Ultimately, you may be asked to do this exercise with your right / left foot only.  STRENGTH  Plantar-flexors, Eccentric  Note: This exercise can place a lot of stress on your foot and ankle. Please complete this exercise only if specifically instructed by your caregiver.  Place the balls of your feet on a step. With your hands, use only enough support from a wall or rail to keep your balance. Keep your knees straight and rise up on your toes. Slowly shift your weight entirely to your right / left toes and pick up your opposite foot. Gently and with controlled movement, lower your weight through your right / left foot so that your heel drops below the level of the step. You will feel a slight stretch in the back of your calf at the end position. Use the healthy leg to help rise up onto the balls of both feet, then lower weight only on the right / left leg  again. Build up to 15 repetitions. Then progress to 3 consecutive sets of 15 repetitions.* After completing the above exercise, complete the same exercise with a slight knee bend (about 30 degrees). Again, build up to 15 repetitions. Then progress to 3 consecutive sets of 15 repetitions.* Perform this exercise 2 times per day.  *When you easily complete 3 sets of 15, your physician, physical therapist or athletic trainer may advise you to add resistance by wearing a backpack filled with additional weight.  STRENGTH - Plantar Flexors, Seated  Sit on a chair that allows your feet to rest flat on the ground. If necessary, sit at the edge of the chair. Keeping your toes firmly on the  ground, lift your right / left heel as far as you can without increasing any discomfort in your ankle. Repeat 3 times. Complete this exercise 2 times a day.

## 2021-11-17 ENCOUNTER — Other Ambulatory Visit: Payer: Self-pay | Admitting: Family Medicine

## 2021-11-17 DIAGNOSIS — F17210 Nicotine dependence, cigarettes, uncomplicated: Secondary | ICD-10-CM

## 2021-12-08 ENCOUNTER — Encounter: Payer: Self-pay | Admitting: Podiatry

## 2021-12-08 ENCOUNTER — Ambulatory Visit: Payer: BC Managed Care – PPO | Admitting: Podiatry

## 2021-12-08 DIAGNOSIS — M7662 Achilles tendinitis, left leg: Secondary | ICD-10-CM

## 2021-12-08 DIAGNOSIS — M722 Plantar fascial fibromatosis: Secondary | ICD-10-CM

## 2021-12-08 NOTE — Progress Notes (Signed)
?  Subjective:  ?Patient ID: Karina Ayers, female    DOB: 01-Jul-1956,   MRN: [id number] ? ?Chief Complaint  ?Patient presents with  ? Plantar Fasciitis  ?  6 week f/u l pf  ? ? ?66 y.o. female presents for concern follow-up of left foot plantar fasciitis and achilles pain. Relates she is doing about 80% better. Relates the meloxicam as well as the brace have been helpful. She also has brooks that are helpful and has been stretching.  Denies n/v/f/c.  ? ?Past Medical History:  ?Diagnosis Date  ? Hypertension   ? Leg edema 05/21/2020  ? Palpitations 05/21/2020  ? PVC's (premature ventricular contractions) 07/22/2020  ? ? ?Objective:  ?Physical Exam: ?Vascular: DP/PT pulses 2/4 bilateral. CFT <3 seconds. Normal hair growth on digits.  Bilateral lower extremity edema.  ?Skin. No lacerations or abrasions bilateral feet.  ?Musculoskeletal: MMT 5/5 bilateral lower extremities in DF, PF, Inversion and Eversion. Deceased ROM in DF of ankle joint. Mildy tender to medial calcaneal tubercle on the left. Some pain along the proximal achilles tendon no pain along insertion. Some pain along lateral ankle and along peroneals.  ?Neurological: Sensation intact to light touch.  ? ?Assessment:  ? ?1. Plantar fasciitis of left foot   ?2. Tendonitis, Achilles, left   ? ? ? ?Plan:  ?Patient was evaluated and treated and all questions answered. ?Discussed plantar fasciitis and Achilles tendonitis  with patient.  ?X-rays reviewed and discussed with patient. No acute fractures or dislocations noted. Mild spurring noted at inferior calcaneus.  ?Discussed treatment options including, ice, NSAIDS, supportive shoes, bracing, and stretching. Continue stretching.  ?Continue brace as needed.  ?Continue supportive shoes.  ?Anti-inflammatories as needed.  ?Follow-up as needed.  ? ?  ? ? ?Louann Sjogren, DPM  ? ? ?

## 2021-12-11 ENCOUNTER — Other Ambulatory Visit: Payer: Self-pay

## 2021-12-11 ENCOUNTER — Ambulatory Visit
Admission: RE | Admit: 2021-12-11 | Discharge: 2021-12-11 | Disposition: A | Discharge status: Home / Self Care | Payer: No Typology Code available for payment source | Source: Ambulatory Visit | Attending: Family Medicine | Admitting: Family Medicine

## 2021-12-11 DIAGNOSIS — R918 Other nonspecific abnormal finding of lung field: Secondary | ICD-10-CM | POA: Insufficient documentation

## 2021-12-11 DIAGNOSIS — F17210 Nicotine dependence, cigarettes, uncomplicated: Secondary | ICD-10-CM | POA: Insufficient documentation

## 2021-12-11 DIAGNOSIS — Z122 Encounter for screening for malignant neoplasm of respiratory organs: Secondary | ICD-10-CM | POA: Insufficient documentation

## 2022-01-28 IMAGING — DX DG CHEST 2V
2 series · 2 of 2 positions shown · non-contrast
Comparison: No prior.

CLINICAL DATA: Dry cough.

EXAM:
CHEST - 2 VIEW

[chest pa]
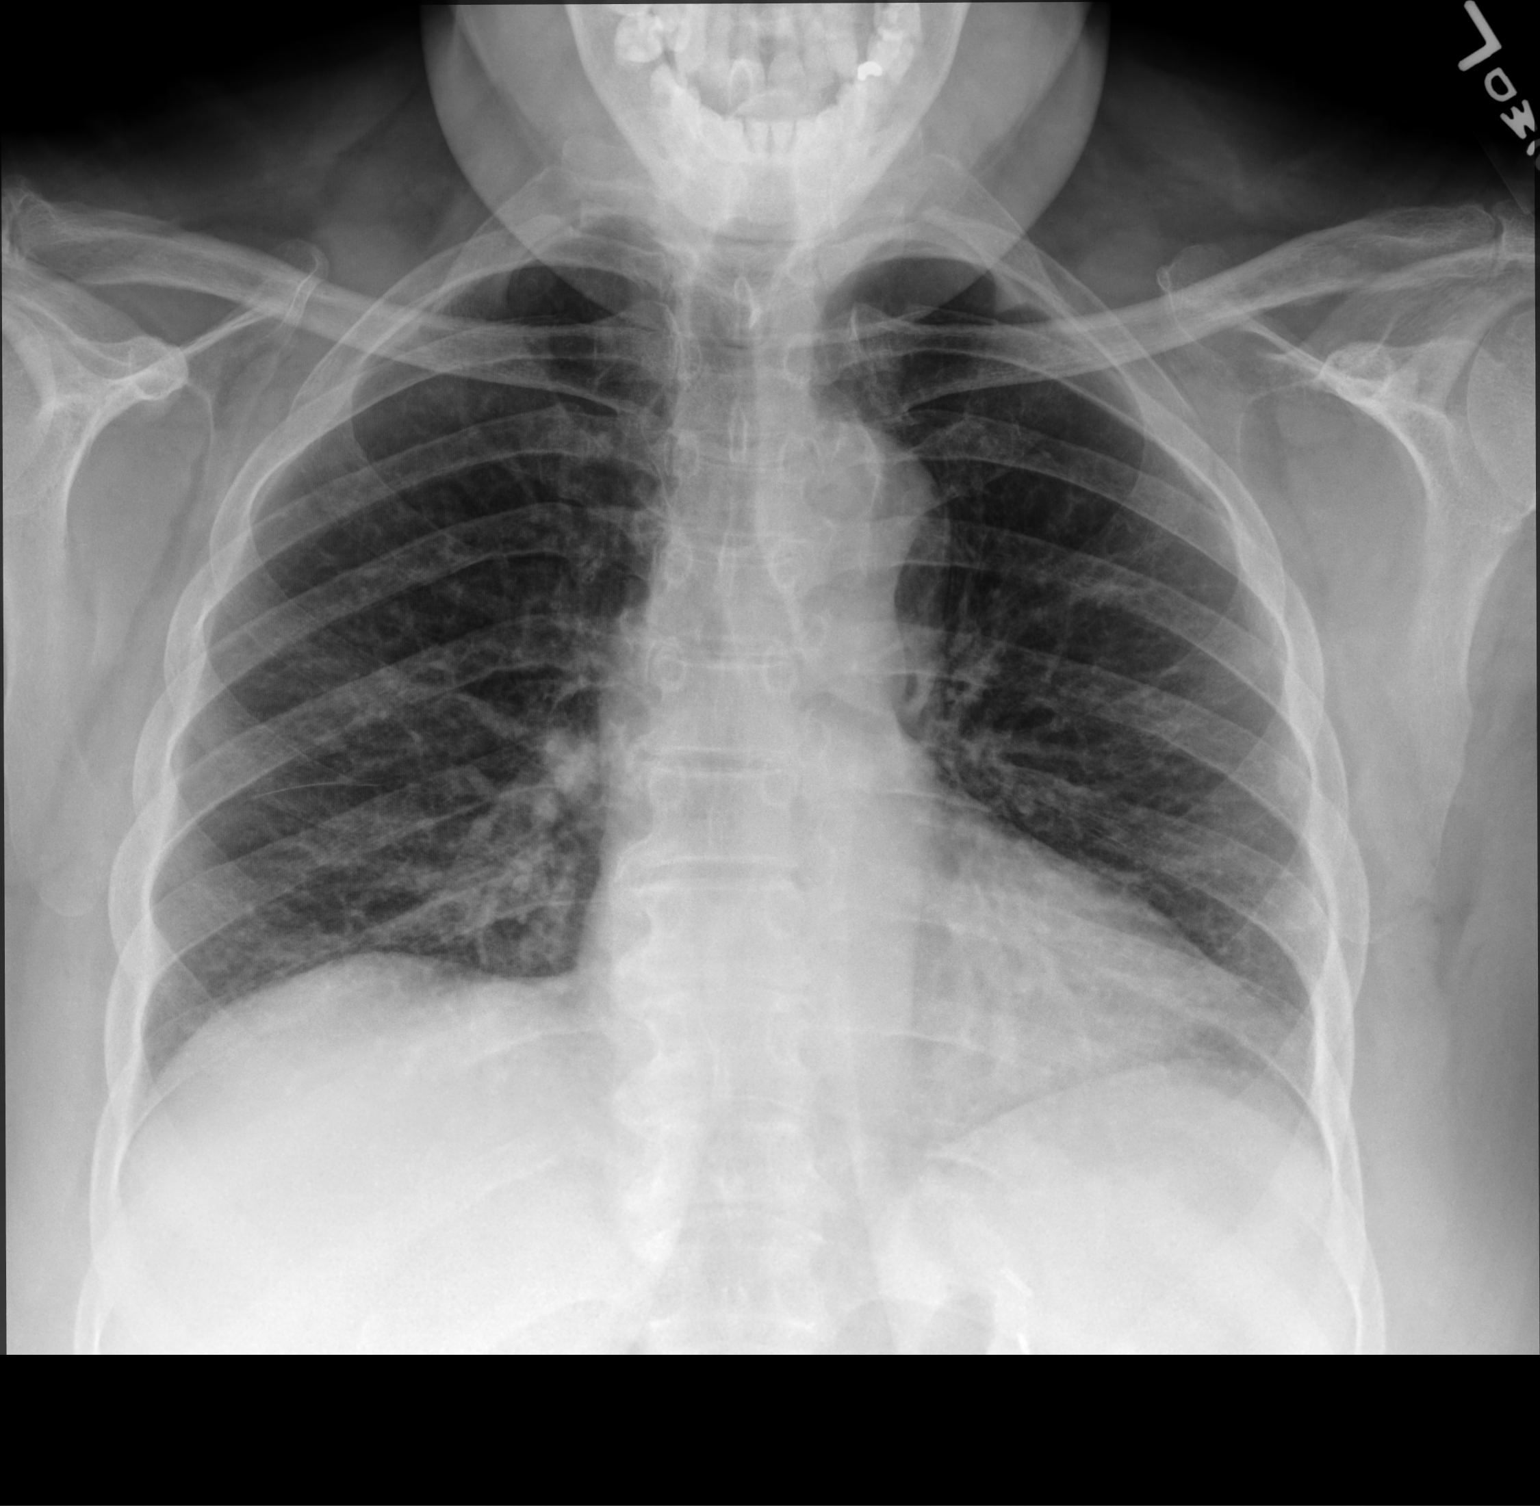

[chest lat]
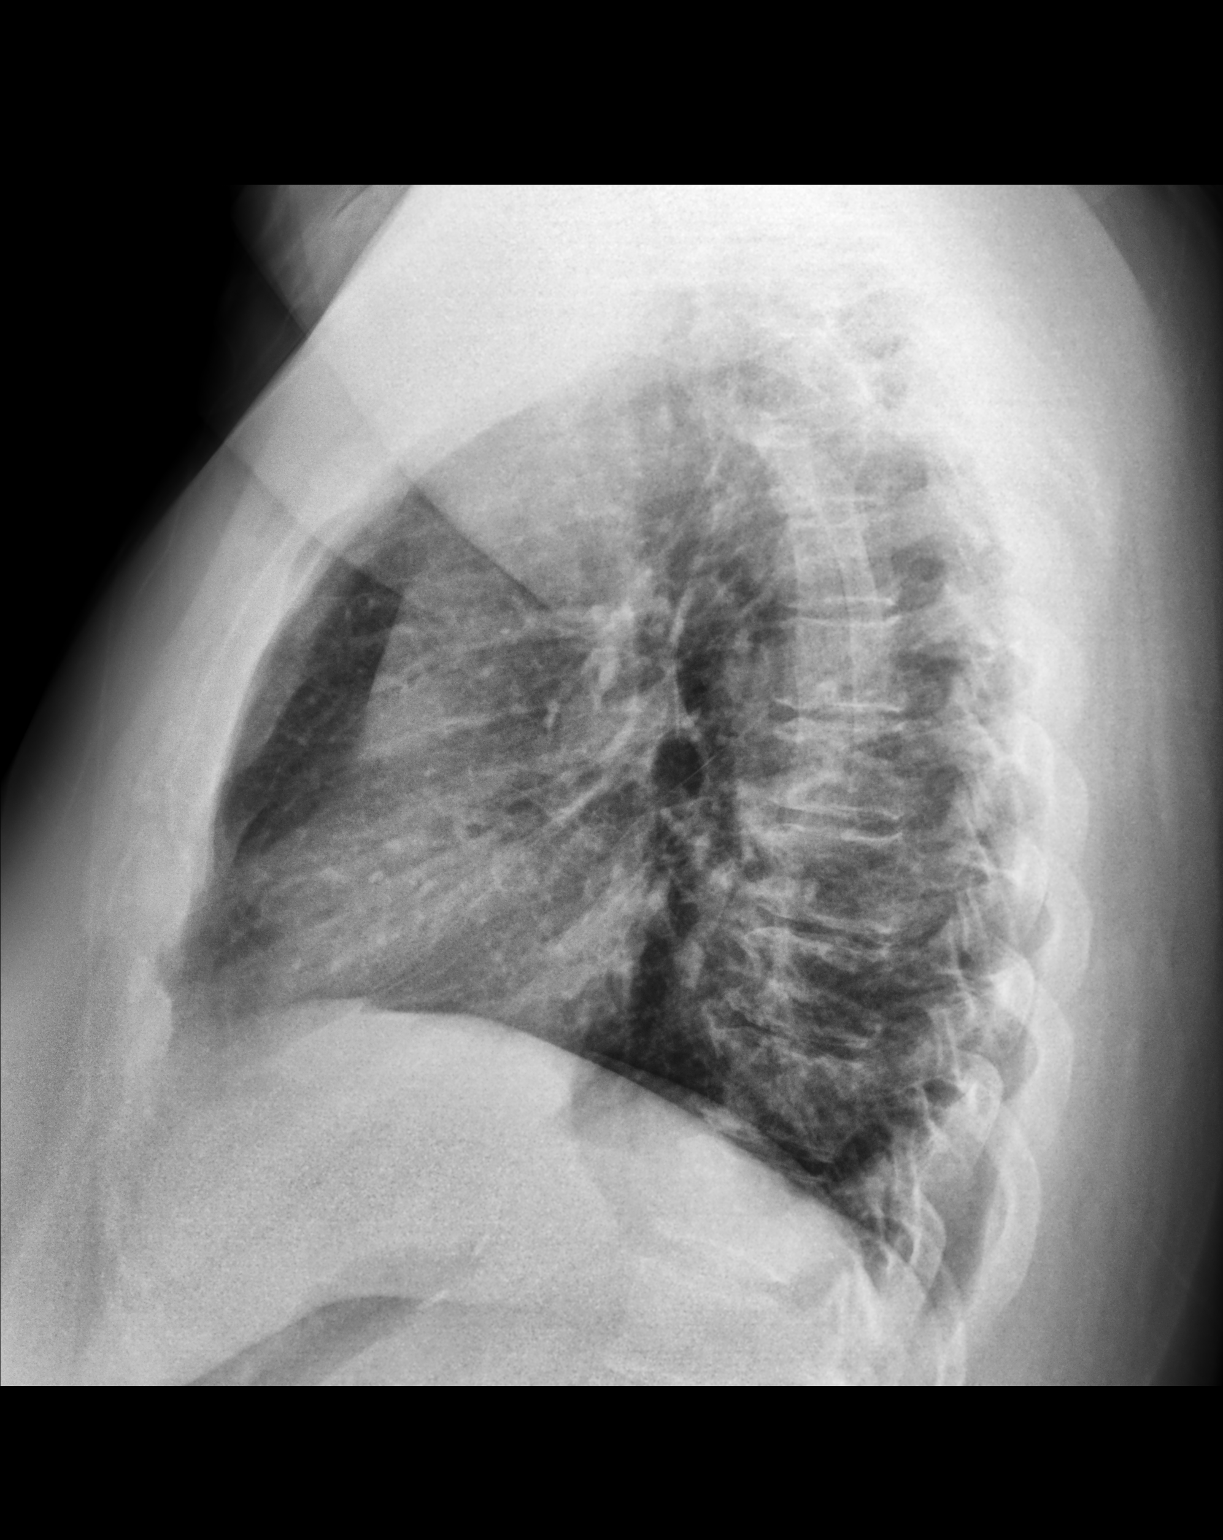

[2 of 2 positions shown; findings below may reference images not displayed]

FINDINGS: Mediastinum and hilar structures normal. Borderline cardiomegaly. No
pulmonary venous congestion. Low lung volumes with mild bibasilar
atelectasis. Mild bibasilar infiltrates cannot be completely
excluded. No pleural effusion or pneumothorax. Surgical clips left
upper quadrant.
IMPRESSION: Low lung volumes with mild bibasilar atelectasis. Mild bibasilar
infiltrates cannot be completely excluded.

## 2022-02-15 ENCOUNTER — Other Ambulatory Visit: Payer: Self-pay

## 2022-02-15 ENCOUNTER — Ambulatory Visit
Admission: EM | Admit: 2022-02-15 | Discharge: 2022-02-15 | Disposition: A | Discharge status: Other Acute Inpatient Hospital | Payer: BC Managed Care – PPO | Source: Home / Self Care

## 2022-02-15 ENCOUNTER — Emergency Department (HOSPITAL_BASED_OUTPATIENT_CLINIC_OR_DEPARTMENT_OTHER): Payer: BC Managed Care – PPO | Admitting: Radiology

## 2022-02-15 ENCOUNTER — Encounter (HOSPITAL_BASED_OUTPATIENT_CLINIC_OR_DEPARTMENT_OTHER): Payer: Self-pay | Admitting: Obstetrics and Gynecology

## 2022-02-15 ENCOUNTER — Emergency Department (HOSPITAL_BASED_OUTPATIENT_CLINIC_OR_DEPARTMENT_OTHER)
Admission: EM | Admit: 2022-02-15 | Discharge: 2022-02-15 | Disposition: A | Discharge status: Home / Self Care | Payer: BC Managed Care – PPO | Attending: Emergency Medicine | Admitting: Emergency Medicine

## 2022-02-15 DIAGNOSIS — M542 Cervicalgia: Secondary | ICD-10-CM

## 2022-02-15 DIAGNOSIS — I10 Essential (primary) hypertension: Secondary | ICD-10-CM | POA: Diagnosis not present

## 2022-02-15 DIAGNOSIS — R0789 Other chest pain: Secondary | ICD-10-CM

## 2022-02-15 DIAGNOSIS — M25512 Pain in left shoulder: Secondary | ICD-10-CM | POA: Diagnosis not present

## 2022-02-15 DIAGNOSIS — M436 Torticollis: Secondary | ICD-10-CM

## 2022-02-15 DIAGNOSIS — M546 Pain in thoracic spine: Secondary | ICD-10-CM | POA: Insufficient documentation

## 2022-02-15 LAB — BASIC METABOLIC PANEL
Anion gap: 11 (ref 5–15)
BUN: 29 mg/dL — ABNORMAL HIGH (ref 8–23)
CO2: 27 mmol/L (ref 22–32)
Calcium: 10.6 mg/dL — ABNORMAL HIGH (ref 8.9–10.3)
Chloride: 100 mmol/L (ref 98–111)
Creatinine, Ser: 1.07 mg/dL — ABNORMAL HIGH (ref 0.44–1.00)
GFR, Estimated: 58 mL/min — ABNORMAL LOW (ref >60)
Glucose, Bld: 94 mg/dL (ref 70–99)
Potassium: 3.8 mmol/L (ref 3.5–5.1)
Sodium: 138 mmol/L (ref 135–145)

## 2022-02-15 LAB — CBC
HCT: 38.2 % (ref 36.0–46.0)
Hemoglobin: 12.9 g/dL (ref 12.0–15.0)
MCH: 25.4 pg — ABNORMAL LOW (ref 26.0–34.0)
MCHC: 33.8 g/dL (ref 30.0–36.0)
MCV: 75.3 fL — ABNORMAL LOW (ref 80.0–100.0)
Platelets: 311 10*3/uL (ref 150–400)
RBC: 5.07 MIL/uL (ref 3.87–5.11)
RDW: 13.2 % (ref 11.5–15.5)
WBC: 5.4 10*3/uL (ref 4.0–10.5)
nRBC: 0 % (ref 0.0–0.2)

## 2022-02-15 LAB — TROPONIN I (HIGH SENSITIVITY): Troponin I (High Sensitivity): 2 ng/L (ref <18)

## 2022-02-15 MED ORDER — METHOCARBAMOL 500 MG PO TABS: 500.0000 mg | ORAL_TABLET | Freq: Two times a day (BID) | ORAL | 0 refills | Status: AC

## 2022-02-15 MED ORDER — LIDOCAINE 5 % EX PTCH
1.0000 | MEDICATED_PATCH | CUTANEOUS | Status: DC
  Administered 2022-02-15: 1 via TRANSDERMAL
  Filled 2022-02-15: qty 1

## 2022-02-15 NOTE — ED Triage Notes (Signed)
Patient reports to the ER for neck pain and shoulder pain that is going down her shoulder and into her back. Patient reports she has been dealing with theses pains x2 weeks.

## 2022-02-15 NOTE — ED Triage Notes (Signed)
 Patient presents to Urgent Care with complaints of back, chest and shoulder pain since last week. Patient reports the back, shoulder and L arm have been hurting for one week but last night at 3 am she began having midsternal pain that lasted 1 hour and was 8/10 in nature. Pt reports no otc pain medications.

## 2022-02-15 NOTE — ED Provider Notes (Signed)
[facility] EMERGENCY DEPT Provider Note   CSN: [id number] Arrival date & time: 02/15/22  1246     History  Chief Complaint  Patient presents with   Neck Pain    Karina Ayers is a 66 y.o. female with history of hypertension who presents to the ED for evaluation of neck and upper back pain and 1 episode of chest pain that occurred earlier this morning.  Patient states that her neck pain has been ongoing for approximately 2 weeks and reports that it feels stiff as if she slept on it wrong.  She has history of gastric bypass and is not typically supposed to be taking ibuprofen, however she has been taking it and was having positive results until recently when it seemed to not improve her pain.  This morning, she became concerned when she had an episode of left-sided chest pain described as "piercing" that lasted for about 1 hour before spontaneous resolution.  Patient follows with Dr. Duke Salvia in cardiology for occasional palpitations but she has no other cardiac history.  She is a non-smoker.  She denies shortness of breath, abdominal pain, nausea, vomiting and diarrhea.  Patient was seen at urgent care earlier today who referred her to the emergency department to rule out cardiac etiology.   Neck Pain      Home Medications Prior to Admission medications   Medication Sig Start Date End Date Taking? Authorizing Provider  methocarbamol (ROBAXIN) 500 MG tablet Take 1 tablet (500 mg total) by mouth 2 (two) times daily. 02/15/22  Yes Raynald Blend R, PA-C  albuterol (VENTOLIN HFA) 108 (90 Base) MCG/ACT inhaler Inhale 1-2 puffs into the lungs every 6 (six) hours as needed for wheezing or shortness of breath. 10/07/20   Wieters, Hallie C, PA-C  chlorthalidone (HYGROTON) 25 MG tablet TAKE 1 TABLET BY MOUTH ONCE DAILY . APPOINTMENT REQUIRED FOR FUTURE REFILLS 10/19/21   Chilton Si, MD  irbesartan (AVAPRO) 75 MG tablet TAKE 1 TABLET BY MOUTH ONCE DAILY . APPOINTMENT REQUIRED FOR  FUTURE REFILLS 10/19/21   Chilton Si, MD  meloxicam (MOBIC) 15 MG tablet Take 1 tablet (15 mg total) by mouth daily. 10/27/21   Louann Sjogren, DPM  methylPREDNISolone (MEDROL DOSEPAK) 4 MG TBPK tablet Take as directed 12/11/20   Wieters, Hallie C, PA-C      Allergies    Cortisone, Losartan potassium, Neosporin [neomycin-bacitracin zn-polymyx], and Prednisone    Review of Systems   Review of Systems  Musculoskeletal:  Positive for neck pain.    Physical Exam Updated Vital Signs BP 137/74   Pulse 73   Temp 98.7 F (37.1 C)   Resp 18   Ht 5\' 2"  (1.575 m)   Wt 115.7 kg   SpO2 99%   BMI 46.64 kg/m  Physical Exam Vitals and nursing note reviewed.  Constitutional:      General: She is not in acute distress.    Appearance: She is not ill-appearing.  HENT:     Head: Atraumatic.  Eyes:     Conjunctiva/sclera: Conjunctivae normal.  Neck:      Comments: Range of motion limited secondary to stiffness.  Tender to palpation of the left trapezius extending down to the shoulder blade. Cardiovascular:     Rate and Rhythm: Normal rate and regular rhythm.     Pulses: Normal pulses.     Heart sounds: No murmur heard. Pulmonary:     Effort: Pulmonary effort is normal. No respiratory distress.     Breath sounds: Normal  breath sounds.  Abdominal:     General: Abdomen is flat. There is no distension.     Palpations: Abdomen is soft.     Tenderness: There is no abdominal tenderness.  Musculoskeletal:        General: Normal range of motion.     Comments: Left shoulder with full range of motion although neck stiffness triggered on shoulder extension  Skin:    General: Skin is warm and dry.     Capillary Refill: Capillary refill takes less than 2 seconds.  Neurological:     General: No focal deficit present.     Mental Status: She is alert.  Psychiatric:        Mood and Affect: Mood normal.     ED Results / Procedures / Treatments   Labs (all labs ordered are listed, but only  abnormal results are displayed) Labs Reviewed  BASIC METABOLIC PANEL - Abnormal; Notable for the following components:      Result Value   BUN 29 (*)    Creatinine, Ser 1.07 (*)    Calcium 10.6 (*)    GFR, Estimated 58 (*)    All other components within normal limits  CBC - Abnormal; Notable for the following components:   MCV 75.3 (*)    MCH 25.4 (*)    All other components within normal limits  TROPONIN I (HIGH SENSITIVITY)  TROPONIN I (HIGH SENSITIVITY)    EKG None  Radiology DG Chest 2 View  Result Date: 02/15/2022 CLINICAL DATA:  Chest pain EXAM: CHEST - 2 VIEW COMPARISON:  10/07/2020 FINDINGS: The heart size and mediastinal contours are within normal limits.No focal airspace disease. No pleural effusion or pneumothorax.No acute osseous abnormality. Thoracic spondylosis. IMPRESSION: No evidence of acute cardiopulmonary disease. Electronically Signed   By: Caprice Renshaw M.D.   On: 02/15/2022 13:31    Procedures Procedures    Medications Ordered in ED Medications  lidocaine (LIDODERM) 5 % 1 patch (has no administration in time range)    ED Course/ Medical Decision Making/ A&P                           Medical Decision Making Amount and/or Complexity of Data Reviewed Labs: ordered. Radiology: ordered.  Risk Prescription drug management.   Social determinants of health:  Social History   Socioeconomic History   Marital status: Married    Spouse name: Not on file   Number of children: Not on file   Years of education: Not on file   Highest education level: Not on file  Occupational History   Not on file  Tobacco Use   Smoking status: Never   Smokeless tobacco: Never  Vaping Use   Vaping Use: Never used  Substance and Sexual Activity   Alcohol use: No   Drug use: No   Sexual activity: Yes    Birth control/protection: Surgical  Other Topics Concern   Not on file  Social History Narrative   Not on file   Social Determinants of Health   Financial  Resource Strain: Not on file  Food Insecurity: Not on file  Transportation Needs: Not on file  Physical Activity: Not on file  Stress: Not on file  Social Connections: Not on file  Intimate Partner Violence: Not on file     Initial impression:  This patient presents to the ED for concern of neck, upper back and chest pain, this involves an extensive number of treatment options,  and is a complaint that carries with it a high risk of complications and morbidity.   Differentials include muscular strain, neck stiffness, ACS, pneumonia.   Comorbidities affecting care:  Hypertension  Additional history obtained: Urgent care record from earlier today  Lab Tests  I Ordered, reviewed, and interpreted labs and EKG.  The pertinent results include:  CBC and troponin normal BMP with mild AKI, similar to previous  Imaging Studies ordered:  I ordered imaging studies including  Chest x-ray without acute findings I independently visualized and interpreted imaging and I agree with the radiologist interpretation.   EKG: Sinus rhythm  Cardiac Monitoring:  The patient was maintained on a cardiac monitor.  I personally viewed and interpreted the cardiac monitored which showed an underlying rhythm of: Sinus rhythm   Medicines ordered and prescription drug management:  I ordered medication including: Lidoderm patch Reevaluation of the patient after these medicines showed that the patient improved I have reviewed the patients home medicines and have made adjustments as needed  ED Course/Re-evaluation: 66 year old female presents to the ED for evaluation of left-sided neck pain and upper back pain along with an episode of left-sided chest pain earlier this morning.  Vitals are without significant abnormality.  On exam, patient is standing up and walking around, in no acute distress.  She is satting well on room air.  She does have tenderness to the left side of her neck extending down the  trapezius to her shoulder blade.  Range of motion limited of her neck secondary to discomfort.  No palpable deformities otherwise.  Heart and lungs CTA.  Labs are without acute findings.  She does have mild AKI noted previously 1 year ago.  Chest x-ray normal.  EKG normal.  Given patient's reproducible musculoskeletal tenderness, I do not think that her symptoms are cardiac in nature.  Additionally, she does not have many cardiac risk factors.  She does follow with Dr. Duke Salvia in cardiology, so advised patient to follow-up with her for reevaluation.  Patient given Lidoderm patch here in the emergency department and discharged home with muscle relaxers.  Strict return precautions were discussed.  Patient expresses understanding and is amenable to plan.  Disposition:  After consideration of the diagnostic results, physical exam, history and the patients response to treatment feel that the patent would benefit from discharge.   Neck stiffness Atypical chest pain: Plan and management as described above. Discharged home in good condition. Final Clinical Impression(s) / ED Diagnoses Final diagnoses:  Neck stiffness    Rx / DC Orders ED Discharge Orders          Ordered    methocarbamol (ROBAXIN) 500 MG tablet  2 times daily        02/15/22 1721              Janell Quiet, [place] 02/15/22 1735    Vanetta Mulders, MD 02/20/22 [phone]

## 2022-02-15 NOTE — ED Notes (Signed)
Patient is being discharged from the Urgent Care and sent to the Emergency Department via pov . Per mound np, patient is in need of higher level of care due to ekg changes. Patient is aware and verbalizes understanding of plan of care.  Vitals:   02/15/22 1053  BP: 140/76  Pulse: 77  Resp: 15  Temp: 97.9 F (36.6 C)  SpO2: 96%

## 2022-03-03 ENCOUNTER — Other Ambulatory Visit: Payer: Self-pay

## 2022-03-03 ENCOUNTER — Ambulatory Visit
Admission: EM | Admit: 2022-03-03 | Discharge: 2022-03-03 | Disposition: A | Discharge status: Home / Self Care | Payer: BC Managed Care – PPO | Attending: Emergency Medicine | Admitting: Emergency Medicine

## 2022-03-03 DIAGNOSIS — U071 COVID-19: Secondary | ICD-10-CM

## 2022-03-03 DIAGNOSIS — Z20822 Contact with and (suspected) exposure to covid-19: Secondary | ICD-10-CM | POA: Diagnosis not present

## 2022-03-03 MED ORDER — CETIRIZINE HCL 10 MG PO TABS: 10.0000 mg | ORAL_TABLET | Freq: Every day | ORAL | 0 refills | Status: AC

## 2022-03-03 MED ORDER — IBUPROFEN 800 MG PO TABS: 800.0000 mg | ORAL_TABLET | Freq: Three times a day (TID) | ORAL | 0 refills | Status: AC

## 2022-03-03 NOTE — ED Provider Notes (Signed)
[facility]   MRN: [id number] DOB: November 23, 1955  Subjective:   Chief Complaint;  Chief Complaint  Patient presents with   covid exposure/cough    Karina Ayers is a 66 y.o. female presenting for cough congestion headache and body aches since yesterday.  Patient had a positive COVID test today.  She denies fever, nausea or vomiting, chest pain or productive cough  No current facility-administered medications for this encounter.  Current Outpatient Medications:    cetirizine (ZYRTEC ALLERGY) 10 MG tablet, Take 1 tablet (10 mg total) by mouth daily for 30 doses., Disp: 30 tablet, Rfl: 0   ibuprofen (ADVIL) 800 MG tablet, Take 1 tablet (800 mg total) by mouth 3 (three) times daily., Disp: 21 tablet, Rfl: 0   albuterol (VENTOLIN HFA) 108 (90 Base) MCG/ACT inhaler, Inhale 1-2 puffs into the lungs every 6 (six) hours as needed for wheezing or shortness of breath., Disp: 18 g, Rfl: 0   chlorthalidone (HYGROTON) 25 MG tablet, TAKE 1 TABLET BY MOUTH ONCE DAILY . APPOINTMENT REQUIRED FOR FUTURE REFILLS, Disp: 30 tablet, Rfl: 0   irbesartan (AVAPRO) 75 MG tablet, TAKE 1 TABLET BY MOUTH ONCE DAILY . APPOINTMENT REQUIRED FOR FUTURE REFILLS, Disp: 30 tablet, Rfl: 0   meloxicam (MOBIC) 15 MG tablet, Take 1 tablet (15 mg total) by mouth daily., Disp: 30 tablet, Rfl: 0   methocarbamol (ROBAXIN) 500 MG tablet, Take 1 tablet (500 mg total) by mouth 2 (two) times daily., Disp: 20 tablet, Rfl: 0   methylPREDNISolone (MEDROL DOSEPAK) 4 MG TBPK tablet, Take as directed, Disp: 21 each, Rfl: 0   Allergies  Allergen Reactions   Cortisone    Losartan Potassium Itching    And headache, nausea   Neosporin [Neomycin-Bacitracin Zn-Polymyx]    Prednisone     Past Medical History:  Diagnosis Date   Hypertension    Leg edema 05/21/2020   Palpitations 05/21/2020   PVC's (premature ventricular contractions) 07/22/2020     Review of Systems  All other systems reviewed and are  negative.    Objective:   Vitals: BP 140/85 (BP Location: Left Arm)   Pulse 93   Temp 99.5 F (37.5 C) (Oral)   Resp 18   SpO2 95%   Physical Exam Constitutional:      Appearance: Normal appearance. She is ill-appearing. She is not toxic-appearing.  HENT:     Head: Atraumatic.     Right Ear: Tympanic membrane and external ear normal.     Left Ear: Tympanic membrane and external ear normal.     Nose: Nose normal. No congestion.     Mouth/Throat:     Mouth: Mucous membranes are moist.  Eyes:     General: No scleral icterus.       Right eye: No discharge.        Left eye: No discharge.     Conjunctiva/sclera: Conjunctivae normal.  Cardiovascular:     Rate and Rhythm: Normal rate and regular rhythm.  Pulmonary:     Effort: Pulmonary effort is normal. No respiratory distress.     Breath sounds: Normal breath sounds. No stridor. No wheezing, rhonchi or rales.  Chest:     Chest wall: No tenderness.  Skin:    General: Skin is warm and dry.  Neurological:     Mental Status: She is alert.  Psychiatric:        Mood and Affect: Mood normal.     No results found for this or any  previous visit (from the past 24 hour(s)).  No results found.     Assessment and Plan :   1. Exposure to COVID-19 virus   2. COVID     Meds ordered this encounter  Medications   ibuprofen (ADVIL) 800 MG tablet    Sig: Take 1 tablet (800 mg total) by mouth 3 (three) times daily.    Dispense:  21 tablet    Refill:  0    Order Specific Question:   Supervising Provider    Answer:   Merrilee Jansky [[id number]]   cetirizine (ZYRTEC ALLERGY) 10 MG tablet    Sig: Take 1 tablet (10 mg total) by mouth daily for 30 doses.    Dispense:  30 tablet    Refill:  0    Order Specific Question:   Supervising Provider    Answer:   Merrilee Jansky [[id number]]    MDM:  LUARA Ayers is a 66 y.o. female presenting for symptoms of COVID with a positive COVID test at home.  Her PCR test is pending.  Her vital  signs are unremarkable she looks tired but nontoxic.  There is no tachycardic or acute respiratory distress.  Oxygen saturation is at 95% with clear lung sounds and she is speaking in full sentences.  I discussed todays findings, treatment plan, follow up and return instructions. Questions were answered. Patient/representative stated understanding of the instructions and patient is stable for discharge.  Dewaine Conger FNP-C MSN    Jone Baseman, NP 03/03/22 [phone]

## 2022-03-03 NOTE — Discharge Instructions (Addendum)
Take Motrin and 2 extra strength Tylenol every 6 hours for body aches and fever add Zyrtec for nasal congestion.  Return for any new or worsening symptoms at any time such as unable to speak in full sentences, chest pain for reevaluation

## 2022-03-03 NOTE — ED Triage Notes (Signed)
Pt c/o cough, nasal drainage, headache, body aches, fever at home  States tested COVID(+) at home.   Onset ~ yesterday

## 2022-03-04 LAB — NOVEL CORONAVIRUS, NAA: SARS-CoV-2, NAA: DETECTED — AB

## 2022-05-24 ENCOUNTER — Other Ambulatory Visit: Payer: Self-pay | Admitting: Family Medicine

## 2022-05-24 DIAGNOSIS — Z1231 Encounter for screening mammogram for malignant neoplasm of breast: Secondary | ICD-10-CM

## 2022-05-26 ENCOUNTER — Ambulatory Visit: Payer: No Typology Code available for payment source

## 2023-09-29 ENCOUNTER — Ambulatory Visit
Admission: EM | Admit: 2023-09-29 | Discharge: 2023-09-29 | Disposition: A | Discharge status: Home / Self Care | Payer: Medicare Other

## 2023-09-29 DIAGNOSIS — J101 Influenza due to other identified influenza virus with other respiratory manifestations: Secondary | ICD-10-CM

## 2023-09-29 LAB — POCT INFLUENZA A/B
Influenza A, POC: POSITIVE — AB
Influenza B, POC: NEGATIVE

## 2023-09-29 MED ORDER — OSELTAMIVIR PHOSPHATE 75 MG PO CAPS: 75.0000 mg | ORAL_CAPSULE | Freq: Two times a day (BID) | ORAL | 0 refills | Status: AC

## 2023-09-29 NOTE — ED Triage Notes (Signed)
 This started with ha, with cough at times, then body aches and now with pain in various areas, congestion, cough remains, scratchy throat. Symptoms started 2 wks ago. No flu or COVID19 vaccine. No PCP visit since symptoms started. Concerned with the Flu.

## 2023-10-04 ENCOUNTER — Encounter: Payer: Self-pay | Admitting: Physician Assistant

## 2023-10-04 NOTE — ED Provider Notes (Signed)
 [facility]    CSN: [id number] Arrival date & time: 09/29/23  1825      History   Chief Complaint Chief Complaint  Patient presents with   Generalized Body Aches   Cough   Headache   Fever    HPI Karina Ayers is a 68 y.o. female.   Patient here today for evaluation of headache, cough and bodyaches that started a few days ago.  She reports initially she had symptoms 2 weeks ago but symptoms improved and recently returned.  She is concerned for possible flu.  She has taken over-the-counter medication without resolution.  The history is provided by the patient.  Cough Associated symptoms: fever, headaches and sore throat   Associated symptoms: no chills, no ear pain, no eye discharge, no shortness of breath and no wheezing   Headache Associated symptoms: congestion, cough, fever and sore throat   Associated symptoms: no abdominal pain, no diarrhea, no ear pain, no nausea and no vomiting   Fever Associated symptoms: congestion, cough, headaches and sore throat   Associated symptoms: no chills, no diarrhea, no ear pain, no nausea and no vomiting     Past Medical History:  Diagnosis Date   Hypertension    Leg edema 05/21/2020   Palpitations 05/21/2020   PVC's (premature ventricular contractions) 07/22/2020    Patient Active Problem List   Diagnosis Date Noted   PVC's (premature ventricular contractions) 07/22/2020   Palpitations 05/21/2020   Leg edema 05/21/2020   Shoulder pain 04/15/2011   HYPERTENSION, BENIGN ESSENTIAL 11/01/2006   ALLERGIC RHINITIS WITH CONJUNCTIVITIS 11/01/2006   OBESITY, NOS 10/20/2006   DEPRESSION, MAJOR, 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 Authorizing Provider  oseltamivir  (TAMIFLU ) 75 MG capsule Take 1 capsule (75 mg total) by mouth every 12 (twelve) hours. 09/29/23  Yes Billy Asberry FALCON, PA-C  UNABLE TO FIND Med Name: Suppliments.   Yes [provider]  albuterol  (VENTOLIN  HFA) 108 (90 Base) MCG/ACT inhaler Inhale 2 puffs into the lungs every 4 (four) hours as needed for wheezing or shortness of breath. 10/19/18   [provider]  celecoxib (CELEBREX) 200 MG capsule Take 200 mg by mouth daily. 01/26/16   [provider]  cetirizine  (ZYRTEC  ALLERGY) 10 MG tablet Take 1 tablet (10 mg total) by mouth daily for 30 doses. 03/03/22 04/02/22  Jeannetta Channing CROME, NP  chlorthalidone  (HYGROTON ) 25 MG tablet TAKE 1 TABLET BY MOUTH ONCE DAILY . APPOINTMENT REQUIRED FOR FUTURE REFILLS 10/19/21   Raford Riggs, MD  cyanocobalamin (VITAMIN B12) 1000 MCG/ML injection Inject 1,000 mcg into the muscle every 30 (thirty) days. 12/04/14   [provider]  cyclobenzaprine  (  FLEXERIL ) 10 MG tablet Take 10 mg by mouth 3 (three) times daily as needed. 07/08/22   [provider]  hydrochlorothiazide (HYDRODIURIL) 25 MG tablet Take 25 mg by mouth daily. 01/26/16   [provider]  ibuprofen  (ADVIL ) 800 MG tablet Take 1 tablet (800 mg total) by mouth 3 (three) times daily. 03/03/22   Jeannetta Channing CROME, NP  Insulin Pen Needle (BD PEN NEEDLE NANO U/F) 32G X 4 MM MISC One needle daily with Saxenda 09/26/18   [provider]  irbesartan  (AVAPRO ) 75 MG tablet TAKE 1 TABLET BY MOUTH ONCE DAILY . APPOINTMENT REQUIRED FOR FUTURE REFILLS 10/19/21   Raford Riggs, MD  losartan (COZAAR) 50 MG tablet Take 50 mg by mouth daily. 12/05/14   [provider]  meloxicam  (MOBIC ) 15 MG tablet Take 1  tablet (15 mg total) by mouth daily. 10/27/21   Sikora, Sy Saintjean, DPM  methocarbamol  (ROBAXIN ) 500 MG tablet Take 1 tablet (500 mg total) by mouth 2 (two) times daily. 02/15/22   Conklin, Erica R, PA-C  Multiple Vitamin (MULTI-VITAMIN) tablet Take 1 tablet by mouth daily.    [provider]  senna-docusate (SENOKOT-S) 8.6-50 MG tablet Take 1 tablet by mouth 2 (two) times daily. 12/05/14   [provider]    Family History Family History  Problem Relation Age of Onset   Hypertension Mother    Diabetes Mother    Diverticulitis Mother    CAD Mother    Breast cancer Mother    Hypertension Father    Dementia Father    Osteoporosis Father    Breast cancer Sister    Hypertension Sister    Diabetes Sister    Breast cancer Maternal Grandmother     Social History Social History   Tobacco Use   Smoking status: Never    Passive exposure: Never   Smokeless tobacco: Never  Vaping Use   Vaping status: Never Used  Substance Use Topics   Alcohol use: No   Drug use: No     Allergies   Losartan potassium, Cortisone, and Neomycin-bacitracin zn-polymyx   Review of Systems Review of Systems  Constitutional:  Positive for fever. Negative for chills.  HENT:  Positive for congestion and sore throat. Negative for ear pain.   Eyes:  Negative for discharge and redness.  Respiratory:  Positive for cough. Negative for shortness of breath and wheezing.   Gastrointestinal:  Negative for abdominal pain, diarrhea, nausea and vomiting.  Neurological:  Positive for headaches.     Physical Exam Triage Vital Signs ED Triage Vitals  Encounter Vitals Group     BP 09/29/23 1946 132/86     Systolic BP Percentile --      Diastolic BP Percentile --      Pulse Rate 09/29/23 1946 97     Resp 09/29/23 1946 (!) 22     Temp 09/29/23 1946 99.4 F (37.4 C)     Temp Source 09/29/23 1946 Oral     SpO2 09/29/23 1946 95 %     Weight 09/29/23 1943 243 lb (110.2 kg)     Height 09/29/23 1943 5'  2 (1.575 m)     Head Circumference --      Peak Flow --      Pain Score 09/29/23 1938 8     Pain Loc --      Pain Education --      Exclude from Growth Chart --    No data found.  Updated Vital Signs BP  132/86 (BP Location: Left Arm)   Pulse 97   Temp 99.4 F (37.4 C) (Oral)   Resp (!) 22   Ht 5' 2 (1.575 m)   Wt 243 lb (110.2 kg)   SpO2 95%   BMI 44.45 kg/m   Visual Acuity Right Eye Distance:   Left Eye Distance:   Bilateral Distance:    Right Eye Near:   Left Eye Near:    Bilateral Near:     Physical Exam Vitals and nursing note reviewed.  Constitutional:      General: She is not in acute distress.    Appearance: Normal appearance. She is not ill-appearing.  HENT:     Head: Normocephalic and atraumatic.     Right Ear: Tympanic membrane normal.     Left Ear: Tympanic membrane normal.     Nose: Congestion present.     Mouth/Throat:     Mouth: Mucous membranes are moist.     Pharynx: No oropharyngeal exudate or posterior oropharyngeal erythema.  Eyes:     Conjunctiva/sclera: Conjunctivae normal.  Cardiovascular:     Rate and Rhythm: Normal rate and regular rhythm.     Heart sounds: Normal heart sounds. No murmur heard. Pulmonary:     Effort: Pulmonary effort is normal. No respiratory distress.     Breath sounds: Normal breath sounds. No wheezing, rhonchi or rales.  Skin:    General: Skin is warm and dry.  Neurological:     Mental Status: She is alert.  Psychiatric:        Mood and Affect: Mood normal.        Thought Content: Thought content normal.      UC Treatments / Results  Labs (all labs ordered are listed, but only abnormal results are displayed) Labs Reviewed  POCT INFLUENZA A/B - Abnormal; Notable for the following components:      Result Value   Influenza A, POC Positive (*)    All other components within normal limits    EKG   Radiology No results found.  Procedures Procedures (including critical care time)  Medications  Ordered in UC Medications - No data to display  Initial Impression / Assessment and Plan / UC Course  I have reviewed the triage vital signs and the nursing notes.  Pertinent labs & imaging results that were available during my care of the patient were reviewed by me and considered in my medical decision making (see chart for details).    Flu test positive.  Will treat with Tamiflu  and advised symptomatic treatment, increase fluids and rest.  Encouraged follow-up if no gradual improvement with any further concerns.  Final Clinical Impressions(s) / UC Diagnoses   Final diagnoses:  Influenza A   Discharge Instructions   None    ED Prescriptions     Medication Sig Dispense Auth. Provider   oseltamivir  (TAMIFLU ) 75 MG capsule Take 1 capsule (75 mg total) by mouth every 12 (twelve) hours. 10 capsule Billy Asberry FALCON, PA-C      PDMP not reviewed this encounter.   Billy Asberry FALCON, PA-C 10/04/23 1527

## 2024-06-01 ENCOUNTER — Encounter: Payer: Self-pay | Admitting: Internal Medicine
